# Patient Record
Sex: Female | Born: 1975 | Race: Black or African American | Hispanic: No | Marital: Single | Smoking: Current some day smoker
Health system: Southern US, Community
[De-identification: ages and names within clinical notes are randomized; demographics above are authoritative.]

## PROBLEM LIST (undated history)

## (undated) DIAGNOSIS — T7840XA Allergy, unspecified, initial encounter: Secondary | ICD-10-CM

## (undated) HISTORY — PX: BREAST EXCISIONAL BIOPSY: SUR124

## (undated) HISTORY — DX: Allergy, unspecified, initial encounter: T78.40XA

## (undated) HISTORY — PX: BREAST SURGERY: SHX581

---

## 2008-12-23 ENCOUNTER — Emergency Department (HOSPITAL_COMMUNITY): Admission: EM | Admit: 2008-12-23 | Discharge: 2008-12-23 | Payer: Self-pay | Admitting: Emergency Medicine

## 2008-12-24 ENCOUNTER — Emergency Department (HOSPITAL_COMMUNITY): Admission: EM | Admit: 2008-12-24 | Discharge: 2008-12-24 | Payer: Self-pay | Admitting: Family Medicine

## 2009-10-29 ENCOUNTER — Emergency Department (HOSPITAL_COMMUNITY): Admission: EM | Admit: 2009-10-29 | Discharge: 2009-10-29 | Payer: Self-pay | Admitting: Emergency Medicine

## 2010-03-30 ENCOUNTER — Emergency Department (HOSPITAL_COMMUNITY): Admission: EM | Admit: 2010-03-30 | Discharge: 2010-03-30 | Payer: Self-pay | Admitting: Emergency Medicine

## 2010-11-14 ENCOUNTER — Inpatient Hospital Stay (HOSPITAL_COMMUNITY): Admission: EM | Admit: 2010-11-14 | Discharge: 2010-11-16 | Payer: Self-pay | Admitting: Emergency Medicine
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
## 2011-03-09 LAB — DIFFERENTIAL
Basophils Relative: 0 % (ref 0–1)
Eosinophils Absolute: 0 10*3/uL (ref 0.0–0.7)
Eosinophils Relative: 2 % (ref 0–5)
Lymphocytes Relative: 7 % — ABNORMAL LOW (ref 12–46)
Lymphs Abs: 0.6 10*3/uL — ABNORMAL LOW (ref 0.7–4.0)
Monocytes Absolute: 0.5 10*3/uL (ref 0.1–1.0)
Monocytes Relative: 4 % (ref 3–12)
Neutro Abs: 12.3 10*3/uL — ABNORMAL HIGH (ref 1.7–7.7)
Neutrophils Relative %: 92 % — ABNORMAL HIGH (ref 43–77)

## 2011-03-09 LAB — POCT I-STAT, CHEM 8
BUN: <3 mg/dL — ABNORMAL LOW (ref 6–23)
Calcium, Ion: 1.18 mmol/L (ref 1.12–1.32)
Chloride: 108 mEq/L (ref 96–112)
Glucose, Bld: 122 mg/dL — ABNORMAL HIGH (ref 70–99)
TCO2: 23 mmol/L (ref 0–100)

## 2011-03-09 LAB — CBC
HCT: 38.8 % (ref 36.0–46.0)
Hemoglobin: 13.3 g/dL (ref 12.0–15.0)
MCH: 33.8 pg (ref 26.0–34.0)
MCHC: 34.2 g/dL (ref 30.0–36.0)
MCV: 99 fL (ref 78.0–100.0)
Platelets: 287 10*3/uL (ref 150–400)
RBC: 3.5 MIL/uL — ABNORMAL LOW (ref 3.87–5.11)
WBC: 8.6 10*3/uL (ref 4.0–10.5)

## 2011-03-09 LAB — COMPREHENSIVE METABOLIC PANEL
BUN: 5 mg/dL — ABNORMAL LOW (ref 6–23)
CO2: 20 mEq/L (ref 19–32)
Chloride: 107 mEq/L (ref 96–112)
Creatinine, Ser: 0.97 mg/dL (ref 0.4–1.2)
GFR calc non Af Amer: >60 mL/min (ref >60)
Total Bilirubin: 0.4 mg/dL (ref 0.3–1.2)

## 2011-03-09 LAB — BASIC METABOLIC PANEL
Calcium: 9.3 mg/dL (ref 8.4–10.5)
Creatinine, Ser: 0.89 mg/dL (ref 0.4–1.2)
GFR calc Af Amer: >60 mL/min (ref >60)
GFR calc non Af Amer: >60 mL/min (ref >60)

## 2011-03-09 LAB — HEMOGLOBIN A1C
Hgb A1c MFr Bld: 5.2 % (ref <5.7)
Mean Plasma Glucose: 103 mg/dL (ref <117)

## 2011-03-09 LAB — PREGNANCY, URINE: Preg Test, Ur: NEGATIVE

## 2011-03-31 LAB — URINALYSIS, ROUTINE W REFLEX MICROSCOPIC
Ketones, ur: 15 mg/dL — AB
Nitrite: NEGATIVE
pH: 6 (ref 5.0–8.0)

## 2011-03-31 LAB — URINE MICROSCOPIC-ADD ON

## 2011-04-16 ENCOUNTER — Emergency Department (HOSPITAL_COMMUNITY)
Admission: EM | Admit: 2011-04-16 | Discharge: 2011-04-17 | Disposition: A | Discharge status: Home / Self Care | Payer: Self-pay | Attending: Emergency Medicine | Admitting: Emergency Medicine

## 2011-04-16 ENCOUNTER — Emergency Department (HOSPITAL_COMMUNITY): Payer: Self-pay

## 2011-04-16 DIAGNOSIS — J45909 Unspecified asthma, uncomplicated: Secondary | ICD-10-CM | POA: Insufficient documentation

## 2011-04-16 DIAGNOSIS — R0602 Shortness of breath: Secondary | ICD-10-CM | POA: Insufficient documentation

## 2011-04-16 DIAGNOSIS — R0682 Tachypnea, not elsewhere classified: Secondary | ICD-10-CM | POA: Insufficient documentation

## 2011-04-16 LAB — CBC
HCT: 37.6 % (ref 36.0–46.0)
Hemoglobin: 12.9 g/dL (ref 12.0–15.0)
MCH: 32.3 pg (ref 26.0–34.0)
MCV: 94.2 fL (ref 78.0–100.0)
RBC: 3.99 MIL/uL (ref 3.87–5.11)

## 2011-04-16 LAB — BASIC METABOLIC PANEL
BUN: 5 mg/dL — ABNORMAL LOW (ref 6–23)
CO2: 24 mEq/L (ref 19–32)
Calcium: 9.2 mg/dL (ref 8.4–10.5)
Creatinine, Ser: 0.84 mg/dL (ref 0.4–1.2)
GFR calc Af Amer: >60 mL/min (ref >60)
Glucose, Bld: 99 mg/dL (ref 70–99)

## 2011-04-16 LAB — DIFFERENTIAL
Eosinophils Absolute: 1.4 10*3/uL — ABNORMAL HIGH (ref 0.0–0.7)
Lymphocytes Relative: 19 % (ref 12–46)
Lymphs Abs: 2.8 10*3/uL (ref 0.7–4.0)
Monocytes Relative: 7 % (ref 3–12)
Neutro Abs: 9.7 10*3/uL — ABNORMAL HIGH (ref 1.7–7.7)
Neutrophils Relative %: 65 % (ref 43–77)

## 2011-05-24 ENCOUNTER — Inpatient Hospital Stay (INDEPENDENT_AMBULATORY_CARE_PROVIDER_SITE_OTHER)
Admission: RE | Admit: 2011-05-24 | Discharge: 2011-05-24 | Disposition: A | Discharge status: Home / Self Care | Payer: Self-pay | Source: Ambulatory Visit | Attending: Family Medicine | Admitting: Family Medicine

## 2011-05-24 DIAGNOSIS — J45909 Unspecified asthma, uncomplicated: Secondary | ICD-10-CM

## 2011-08-04 ENCOUNTER — Inpatient Hospital Stay (INDEPENDENT_AMBULATORY_CARE_PROVIDER_SITE_OTHER)
Admission: RE | Admit: 2011-08-04 | Discharge: 2011-08-04 | Disposition: A | Discharge status: Home / Self Care | Payer: Self-pay | Source: Ambulatory Visit | Attending: Family Medicine | Admitting: Family Medicine

## 2011-08-04 DIAGNOSIS — J45909 Unspecified asthma, uncomplicated: Secondary | ICD-10-CM

## 2011-08-24 ENCOUNTER — Emergency Department (HOSPITAL_COMMUNITY)
Admission: EM | Admit: 2011-08-24 | Discharge: 2011-08-24 | Disposition: A | Discharge status: Home / Self Care | Payer: Self-pay | Attending: Emergency Medicine | Admitting: Emergency Medicine

## 2011-08-24 ENCOUNTER — Emergency Department (HOSPITAL_COMMUNITY): Payer: Self-pay

## 2011-08-24 DIAGNOSIS — M940 Chondrocostal junction syndrome [Tietze]: Secondary | ICD-10-CM | POA: Insufficient documentation

## 2011-08-24 DIAGNOSIS — R071 Chest pain on breathing: Secondary | ICD-10-CM | POA: Insufficient documentation

## 2011-08-24 DIAGNOSIS — R0682 Tachypnea, not elsewhere classified: Secondary | ICD-10-CM | POA: Insufficient documentation

## 2011-08-24 DIAGNOSIS — J069 Acute upper respiratory infection, unspecified: Secondary | ICD-10-CM | POA: Insufficient documentation

## 2011-08-24 DIAGNOSIS — R000 Tachycardia, unspecified: Secondary | ICD-10-CM | POA: Insufficient documentation

## 2011-08-24 DIAGNOSIS — R0602 Shortness of breath: Secondary | ICD-10-CM | POA: Insufficient documentation

## 2011-08-24 DIAGNOSIS — R51 Headache: Secondary | ICD-10-CM | POA: Insufficient documentation

## 2011-08-24 DIAGNOSIS — J3489 Other specified disorders of nose and nasal sinuses: Secondary | ICD-10-CM | POA: Insufficient documentation

## 2011-08-24 DIAGNOSIS — R05 Cough: Secondary | ICD-10-CM | POA: Insufficient documentation

## 2011-08-24 DIAGNOSIS — R059 Cough, unspecified: Secondary | ICD-10-CM | POA: Insufficient documentation

## 2011-08-24 DIAGNOSIS — R070 Pain in throat: Secondary | ICD-10-CM | POA: Insufficient documentation

## 2011-08-24 DIAGNOSIS — J45909 Unspecified asthma, uncomplicated: Secondary | ICD-10-CM | POA: Insufficient documentation

## 2011-08-24 DIAGNOSIS — M549 Dorsalgia, unspecified: Secondary | ICD-10-CM | POA: Insufficient documentation

## 2011-08-24 LAB — DIFFERENTIAL
Basophils Absolute: 0 10*3/uL (ref 0.0–0.1)
Basophils Relative: 0 % (ref 0–1)
Eosinophils Absolute: 0.5 10*3/uL (ref 0.0–0.7)
Neutro Abs: 11.3 10*3/uL — ABNORMAL HIGH (ref 1.7–7.7)
Neutrophils Relative %: 70 % (ref 43–77)

## 2011-08-24 LAB — POCT I-STAT, CHEM 8
BUN: 9 mg/dL (ref 6–23)
Creatinine, Ser: 1 mg/dL (ref 0.50–1.10)
Hemoglobin: 13.9 g/dL (ref 12.0–15.0)
Potassium: 4.2 mEq/L (ref 3.5–5.1)
Sodium: 138 mEq/L (ref 135–145)

## 2011-08-24 LAB — URINALYSIS, ROUTINE W REFLEX MICROSCOPIC
Glucose, UA: NEGATIVE mg/dL
Hgb urine dipstick: NEGATIVE
Ketones, ur: 15 mg/dL — AB
pH: 5.5 (ref 5.0–8.0)

## 2011-08-24 LAB — URINE MICROSCOPIC-ADD ON

## 2011-08-24 LAB — CBC
Hemoglobin: 13.2 g/dL (ref 12.0–15.0)
MCHC: 35.8 g/dL (ref 30.0–36.0)
Platelets: 296 10*3/uL (ref 150–400)
RBC: 4.01 MIL/uL (ref 3.87–5.11)

## 2011-08-24 LAB — D-DIMER, QUANTITATIVE: D-Dimer, Quant: 0.45 ug/mL-FEU (ref 0.00–0.48)

## 2011-09-09 ENCOUNTER — Emergency Department (HOSPITAL_COMMUNITY)
Admission: EM | Admit: 2011-09-09 | Discharge: 2011-09-10 | Disposition: A | Discharge status: Home / Self Care | Payer: Self-pay | Attending: Emergency Medicine | Admitting: Emergency Medicine

## 2011-09-09 ENCOUNTER — Emergency Department (HOSPITAL_COMMUNITY): Payer: Self-pay

## 2011-09-09 DIAGNOSIS — J45909 Unspecified asthma, uncomplicated: Secondary | ICD-10-CM | POA: Insufficient documentation

## 2011-09-09 DIAGNOSIS — R0602 Shortness of breath: Secondary | ICD-10-CM | POA: Insufficient documentation

## 2011-09-09 LAB — CBC
HCT: 36 % (ref 36.0–46.0)
Hemoglobin: 12.5 g/dL (ref 12.0–15.0)
MCH: 32.5 pg (ref 26.0–34.0)
MCHC: 34.7 g/dL (ref 30.0–36.0)
MCV: 93.5 fL (ref 78.0–100.0)
Platelets: 347 10*3/uL (ref 150–400)
RBC: 3.85 MIL/uL — ABNORMAL LOW (ref 3.87–5.11)
RDW: 12.6 % (ref 11.5–15.5)
WBC: 10.7 10*3/uL — ABNORMAL HIGH (ref 4.0–10.5)

## 2011-09-09 LAB — POCT I-STAT, CHEM 8
Creatinine, Ser: 0.8 mg/dL (ref 0.50–1.10)
Hemoglobin: 13.3 g/dL (ref 12.0–15.0)
Potassium: 3.3 mEq/L — ABNORMAL LOW (ref 3.5–5.1)
Sodium: 139 mEq/L (ref 135–145)
TCO2: 22 mmol/L (ref 0–100)

## 2011-09-09 LAB — DIFFERENTIAL
Basophils Absolute: 0 10*3/uL (ref 0.0–0.1)
Basophils Relative: 0 % (ref 0–1)
Eosinophils Absolute: 0.3 10*3/uL (ref 0.0–0.7)
Eosinophils Relative: 2 % (ref 0–5)
Lymphocytes Relative: 22 % (ref 12–46)
Lymphs Abs: 2.4 10*3/uL (ref 0.7–4.0)
Monocytes Absolute: 1.2 10*3/uL — ABNORMAL HIGH (ref 0.1–1.0)
Monocytes Relative: 11 % (ref 3–12)
Neutro Abs: 6.9 10*3/uL (ref 1.7–7.7)
Neutrophils Relative %: 64 % (ref 43–77)

## 2011-10-10 ENCOUNTER — Emergency Department (HOSPITAL_COMMUNITY)
Admission: EM | Admit: 2011-10-10 | Discharge: 2011-10-10 | Disposition: A | Discharge status: Home / Self Care | Payer: Self-pay | Attending: Emergency Medicine | Admitting: Emergency Medicine

## 2011-10-10 DIAGNOSIS — R05 Cough: Secondary | ICD-10-CM | POA: Insufficient documentation

## 2011-10-10 DIAGNOSIS — J45909 Unspecified asthma, uncomplicated: Secondary | ICD-10-CM | POA: Insufficient documentation

## 2011-10-10 DIAGNOSIS — R059 Cough, unspecified: Secondary | ICD-10-CM | POA: Insufficient documentation

## 2011-10-25 ENCOUNTER — Emergency Department (HOSPITAL_COMMUNITY): Payer: No Typology Code available for payment source

## 2011-10-25 ENCOUNTER — Emergency Department (HOSPITAL_COMMUNITY)
Admission: EM | Admit: 2011-10-25 | Discharge: 2011-10-25 | Disposition: A | Discharge status: Home / Self Care | Payer: No Typology Code available for payment source | Attending: Emergency Medicine | Admitting: Emergency Medicine

## 2011-10-25 DIAGNOSIS — R51 Headache: Secondary | ICD-10-CM | POA: Insufficient documentation

## 2011-10-25 DIAGNOSIS — M542 Cervicalgia: Secondary | ICD-10-CM | POA: Insufficient documentation

## 2011-10-25 DIAGNOSIS — M545 Low back pain, unspecified: Secondary | ICD-10-CM | POA: Insufficient documentation

## 2011-10-25 DIAGNOSIS — N949 Unspecified condition associated with female genital organs and menstrual cycle: Secondary | ICD-10-CM | POA: Insufficient documentation

## 2011-10-25 DIAGNOSIS — J45909 Unspecified asthma, uncomplicated: Secondary | ICD-10-CM | POA: Insufficient documentation

## 2011-11-13 ENCOUNTER — Encounter: Payer: Self-pay | Admitting: *Deleted

## 2011-11-13 ENCOUNTER — Emergency Department (HOSPITAL_COMMUNITY)
Admission: EM | Admit: 2011-11-13 | Discharge: 2011-11-14 | Disposition: A | Discharge status: Home / Self Care | Payer: Self-pay | Attending: Emergency Medicine | Admitting: Emergency Medicine

## 2011-11-13 DIAGNOSIS — F17200 Nicotine dependence, unspecified, uncomplicated: Secondary | ICD-10-CM | POA: Insufficient documentation

## 2011-11-13 DIAGNOSIS — J45901 Unspecified asthma with (acute) exacerbation: Secondary | ICD-10-CM

## 2011-11-13 DIAGNOSIS — R0989 Other specified symptoms and signs involving the circulatory and respiratory systems: Secondary | ICD-10-CM | POA: Insufficient documentation

## 2011-11-13 DIAGNOSIS — R0602 Shortness of breath: Secondary | ICD-10-CM | POA: Insufficient documentation

## 2011-11-13 DIAGNOSIS — R059 Cough, unspecified: Secondary | ICD-10-CM | POA: Insufficient documentation

## 2011-11-13 DIAGNOSIS — Z79899 Other long term (current) drug therapy: Secondary | ICD-10-CM | POA: Insufficient documentation

## 2011-11-13 DIAGNOSIS — R05 Cough: Secondary | ICD-10-CM | POA: Insufficient documentation

## 2011-11-13 MED ORDER — PREDNISONE 20 MG PO TABS
40.0000 mg | ORAL_TABLET | Freq: Once | ORAL | Status: AC
  Administered 2011-11-14: 40 mg via ORAL
  Filled 2011-11-13: qty 2

## 2011-11-13 MED ORDER — ALBUTEROL SULFATE HFA 108 (90 BASE) MCG/ACT IN AERS
2.0000 | INHALATION_SPRAY | Freq: Once | RESPIRATORY_TRACT | Status: DC
  Filled 2011-11-13: qty 6.7

## 2011-11-13 MED ORDER — FLUTICASONE PROPIONATE HFA 44 MCG/ACT IN AERO
1.0000 | INHALATION_SPRAY | Freq: Once | RESPIRATORY_TRACT | Status: AC
  Administered 2011-11-14: 1 via RESPIRATORY_TRACT

## 2011-11-13 MED ORDER — ALBUTEROL SULFATE (5 MG/ML) 0.5% IN NEBU
5.0000 mg | INHALATION_SOLUTION | Freq: Once | RESPIRATORY_TRACT | Status: AC
  Administered 2011-11-13: 5 mg via RESPIRATORY_TRACT

## 2011-11-13 NOTE — ED Notes (Signed)
Patient experiencing asthma exacerbation.   Patient is very short of breath, when she talks she gets short of breath even worse, sweathing

## 2011-11-14 ENCOUNTER — Encounter (HOSPITAL_COMMUNITY): Payer: Self-pay | Admitting: Emergency Medicine

## 2011-11-14 MED ORDER — PREDNISONE 20 MG PO TABS: 40.0000 mg | ORAL_TABLET | Freq: Every day | ORAL | Status: AC

## 2011-11-14 NOTE — ED Provider Notes (Signed)
History     CSN: [id number] Arrival date & time: 11/13/2011 11:17 PM   First MD Initiated Contact with Patient 11/13/11 2339      Chief Complaint  Patient presents with  . Asthma    (Consider location/radiation/quality/duration/timing/severity/associated sxs/prior treatment) HPI Comments: Patient reports that she was diagnosed with asthma approximately 2 or 3 years ago. She has cut down significantly on smoking, does not smoke regularly, occasionally smokes socially. She has not smoked this entire week. She reports that she had some chest congestion last week and still has a mild cough but no production. She denies any fever, chills, or runny nose, sore throat. She denies any vomiting or diarrhea. She is nearly out of her current rescue inhaler. She has been taking some over-the-counter cough syrup as well. She does not have a primary care physician in his never been on many times maintenance medications. She reports that over the past year her asthma attack frequency has increased. She thinks she may have one or 2 attacks per week at this point. She did receive an albuterol nebulizer by protocol upon arrival here to the emergency department and she reports feeling much improved already. Denies any chest pain, sweats.  Patient is a 35 y.o. female presenting with asthma. The history is provided by the patient.  Asthma Associated symptoms include shortness of breath.    Past Medical History  Diagnosis Date  . Asthma     History reviewed. No pertinent past surgical history.  History reviewed. No pertinent family history.  History  Substance Use Topics  . Smoking status: Current Some Day Smoker    Types: Cigarettes  . Smokeless tobacco: Not on file  . Alcohol Use: No    OB History    Grav Para Term Preterm Abortions TAB SAB Ect Mult Living                  Review of Systems  Constitutional: Negative.   HENT: Negative for congestion and postnasal drip.   Respiratory:  Positive for cough, shortness of breath and wheezing.   All other systems reviewed and are negative.    Allergies  Review of patient's allergies indicates no known allergies.  Home Medications   Current Outpatient Rx  Name Route Sig Dispense Refill  . ALBUTEROL SULFATE HFA 108 (90 BASE) MCG/ACT IN AERS Inhalation Inhale 2 puffs into the lungs every 4 (four) hours as needed. For shortness of breath.    . GUAIFENESIN-DM 100-10 MG/5ML PO SYRP Oral Take 10 mLs by mouth 3 (three) times daily as needed. For congestion.     Marland Kitchen PREDNISONE PO Oral Take by mouth. Patient was taking Prednisone; she said she is out and needs more, and also stated that everytime she was prescribed Prednisone, that it changes dosage.       BP 110/62  Pulse 109  Temp(Src) 98.2 F (36.8 C) (Oral)  Resp 24  SpO2 95%  LMP 09/25/2011  Physical Exam  Nursing note and vitals reviewed. Constitutional: She appears well-developed and well-nourished.  Cardiovascular: Normal rate.   Pulmonary/Chest: Effort normal. No respiratory distress.       Minimal expiratory wheeze diffusely. No coughing during my entire history and physical examination.    ED Course  Procedures (including critical care time)  Labs Reviewed - No data to display No results found.   No diagnosis found.  RA sat is 95% and WNL for an asthmatic  MDM    Patient is already feeling improved after her  nebulizer treatment. She is nearly out of her current inhaler. My plan is to give her an inhaler for home as well as give her a trial of maintenance Flovent. She knows that she needs to obtain a primary care physician so she does not have to come the emergency department as recently. We did discuss fully quitting smoking which she is encouraged to try to do. I do not suspect that she had any cardiac or a significant infectious process. As patient is RE feeling improved the patient will likely be able to be discharged after her medications. I also plan  to give her 40 mg of prednisone to continue for the next 5 days.        Gavin Pound. Oletta Lamas, MD 11/14/11 0010

## 2011-11-14 NOTE — Discharge Instructions (Signed)
 Please use albuterol  inhaler 2 puffs every 6 hours for the next 24 hours, then may use as needed every 4-6 hours.  Use flovent  to help prevent flares, 1 puff twice daily.  I encourage that you stop smoking completely and that you establish with a primary care physician.     Asthma Attack Prevention HOW CAN ASTHMA BE PREVENTED? Currently, there is no way to prevent asthma from starting. However, you can take steps to control the disease and prevent its symptoms after you have been diagnosed. Learn about your asthma and how to control it. Take an active role to control your asthma by working with your caregiver to create and follow an asthma action plan. An asthma action plan guides you in taking your medicines properly, avoiding factors that make your asthma worse, tracking your level of asthma control, responding to worsening asthma, and seeking emergency care when needed. To track your asthma, keep records of your symptoms, check your peak flow number using a peak flow meter (handheld device that shows how well air moves out of your lungs), and get regular asthma checkups.  Other ways to prevent asthma attacks include:  Use medicines as your caregiver directs.   Identify and avoid things that make your asthma worse (as much as you can).   Keep track of your asthma symptoms and level of control.   Get regular checkups for your asthma.   With your caregiver, write a detailed plan for taking medicines and managing an asthma attack. Then be sure to follow your action plan. Asthma is an ongoing condition that needs regular monitoring and treatment.   Identify and avoid asthma triggers. A number of outdoor allergens and irritants (pollen, mold, cold air, air pollution) can trigger asthma attacks. Find out what causes or makes your asthma worse, and take steps to avoid those triggers (see below).   Monitor your breathing. Learn to recognize warning signs of an attack, such as slight coughing,  wheezing or shortness of breath. However, your lung function may already decrease before you notice any signs or symptoms, so regularly measure and record your peak airflow with a home peak flow meter.   Identify and treat attacks early. If you act quickly, you're less likely to have a severe attack. You will also need less medicine to control your symptoms. When your peak flow measurements decrease and alert you to an upcoming attack, take your medicine as instructed, and immediately stop any activity that may have triggered the attack. If your symptoms do not improve, get medical help.   Pay attention to increasing quick-relief inhaler use. If you find yourself relying on your quick-relief inhaler (such as albuterol ), your asthma is not under control. See your caregiver about adjusting your treatment.  IDENTIFY AND CONTROL FACTORS THAT MAKE YOUR ASTHMA WORSE A number of common things can set off or make your asthma symptoms worse (asthma triggers). Keep track of your asthma symptoms for several weeks, detailing all the environmental and emotional factors that are linked with your asthma. When you have an asthma attack, go back to your asthma diary to see which factor, or combination of factors, might have contributed to it. Once you know what these factors are, you can take steps to control many of them.  Allergies: If you have allergies and asthma, it is important to take asthma prevention steps at home. Asthma attacks (worsening of asthma symptoms) can be triggered by allergies, which can cause temporary increased inflammation of your airways. Minimizing contact  with the substance to which you are allergic will help prevent an asthma attack. Animal Dander:   Some people are allergic to the flakes of skin or dried saliva from animals with fur or feathers. Keep these pets out of your home.   If you can't keep a pet outdoors, keep the pet out of your bedroom and other sleeping areas at all times, and  keep the door closed.   Remove carpets and furniture covered with cloth from your home. If that is not possible, keep the pet away from fabric-covered furniture and carpets.  Dust Mites:  Many people with asthma are allergic to dust mites. Dust mites are tiny bugs that are found in every home, in mattresses, pillows, carpets, fabric-covered furniture, bedcovers, clothes, stuffed toys, fabric, and other fabric-covered items.   Cover your mattress in a special dust-proof cover.   Cover your pillow in a special dust-proof cover, or wash the pillow each week in hot water. Water must be hotter than 130 F to kill dust mites. Cold or warm water used with detergent and bleach can also be effective.   Wash the sheets and blankets on your bed each week in hot water.   Try not to sleep or lie on cloth-covered cushions.   Call ahead when traveling and ask for a smoke-free hotel room. Bring your own bedding and pillows, in case the hotel only supplies feather pillows and down comforters, which may contain dust mites and cause asthma symptoms.   Remove carpets from your bedroom and those laid on concrete, if you can.   Keep stuffed toys out of the bed, or wash the toys weekly in hot water or cooler water with detergent and bleach.  Cockroaches:  Many people with asthma are allergic to the droppings and remains of cockroaches.   Keep food and garbage in closed containers. Never leave food out.   Use poison baits, traps, powders, gels, or paste (for example, boric acid).   If a spray is used to kill cockroaches, stay out of the room until the odor goes away.  Indoor Mold:  Fix leaky faucets, pipes, or other sources of water that have mold around them.   Clean moldy surfaces with a cleaner that has bleach in it.  Pollen and Outdoor Mold:  When pollen or mold spore counts are high, try to keep your windows closed.   Stay indoors with windows closed from late morning to afternoon, if you can.  Pollen and some mold spore counts are highest at that time.   Ask your caregiver whether you need to take or increase anti-inflammatory medicine before your allergy season starts.  Irritants:   Tobacco smoke is an irritant. If you smoke, ask your caregiver how you can quit. Ask family members to quit smoking, too. Do not allow smoking in your home or car.   If possible, do not use a wood-burning stove, kerosene heater, or fireplace. Minimize exposure to all sources of smoke, including incense, candles, fires, and fireworks.   Try to stay away from strong odors and sprays, such as perfume, talcum powder, hair spray, and paints.   Decrease humidity in your home and use an indoor air cleaning device. Reduce indoor humidity to below 60 percent. Dehumidifiers or central air conditioners can do this.   Try to have someone else vacuum for you once or twice a week, if you can. Stay out of rooms while they are being vacuumed and for a short while afterward.  If you vacuum, use a dust mask from a hardware store, a double-layered or microfilter vacuum cleaner bag, or a vacuum cleaner with a HEPA filter.   Sulfites in foods and beverages can be irritants. Do not drink beer or wine, or eat dried fruit, processed potatoes, or shrimp if they cause asthma symptoms.   Cold air can trigger an asthma attack. Cover your nose and mouth with a scarf on cold or windy days.   Several health conditions can make asthma more difficult to manage, including runny nose, sinus infections, reflux disease, psychological stress, and sleep apnea. Your caregiver will treat these conditions, as well.   Avoid close contact with people who have a cold or the flu, since your asthma symptoms may get worse if you catch the infection from them. Wash your hands thoroughly after touching items that may have been handled by people with a respiratory infection.   Get a flu shot every year to protect against the flu virus, which often  makes asthma worse for days or weeks. Also get a pneumonia shot once every five to 10 years.  Drugs:  Aspirin and other painkillers can cause asthma attacks. 10% to 20% of people with asthma have sensitivity to aspirin or a group of painkillers called non-steroidal anti-inflammatory drugs (NSAIDS), such as ibuprofen  and naproxen . These drugs are used to treat pain and reduce fevers. Asthma attacks caused by any of these medicines can be severe and even fatal. These drugs must be avoided in people who have known aspirin sensitive asthma. Products with acetaminophen  are considered safe for people who have asthma. It is important that people with aspirin sensitivity read labels of all over-the-counter drugs used to treat pain, colds, coughs, and fever.   Beta blockers and ACE inhibitors are other drugs which you should discuss with your caregiver, in relation to your asthma.  ALLERGY SKIN TESTING  Ask your asthma caregiver about allergy skin testing or blood testing (RAST test) to identify the allergens to which you are sensitive. If you are found to have allergies, allergy shots (immunotherapy) for asthma may help prevent future allergies and asthma. With allergy shots, small doses of allergens (substances to which you are allergic) are injected under your skin on a regular schedule. Over a period of time, your body may become used to the allergen and less responsive with asthma symptoms. You can also take measures to minimize your exposure to those allergens. EXERCISE  If you have exercise-induced asthma, or are planning vigorous exercise, or exercise in cold, humid, or dry environments, prevent exercise-induced asthma by following your caregiver's advice regarding asthma treatment before exercising. Document Released: 12/01/2009 Document Revised: 08/25/2011 Document Reviewed: 12/01/2009 [facility] Patient Information 2012 [place], [place].

## 2011-12-17 ENCOUNTER — Encounter (HOSPITAL_COMMUNITY): Payer: Self-pay | Admitting: *Deleted

## 2011-12-17 ENCOUNTER — Emergency Department (HOSPITAL_COMMUNITY)
Admission: EM | Admit: 2011-12-17 | Discharge: 2011-12-17 | Disposition: A | Discharge status: Home / Self Care | Payer: Self-pay | Attending: Emergency Medicine | Admitting: Emergency Medicine

## 2011-12-17 DIAGNOSIS — R0602 Shortness of breath: Secondary | ICD-10-CM | POA: Insufficient documentation

## 2011-12-17 DIAGNOSIS — J45909 Unspecified asthma, uncomplicated: Secondary | ICD-10-CM | POA: Insufficient documentation

## 2011-12-17 MED ORDER — PREDNISONE 10 MG PO TABS: 20.0000 mg | ORAL_TABLET | Freq: Every day | ORAL | Status: DC

## 2011-12-17 MED ORDER — ALBUTEROL SULFATE HFA 108 (90 BASE) MCG/ACT IN AERS
2.0000 | INHALATION_SPRAY | RESPIRATORY_TRACT | Status: DC | PRN
  Administered 2011-12-17: 2 via RESPIRATORY_TRACT
  Filled 2011-12-17: qty 6.7

## 2011-12-17 MED ORDER — ALBUTEROL SULFATE (5 MG/ML) 0.5% IN NEBU
5.0000 mg | INHALATION_SOLUTION | Freq: Once | RESPIRATORY_TRACT | Status: AC
  Administered 2011-12-17: 5 mg via RESPIRATORY_TRACT
  Filled 2011-12-17: qty 1

## 2011-12-17 MED ORDER — IPRATROPIUM BROMIDE 0.02 % IN SOLN
0.5000 mg | Freq: Once | RESPIRATORY_TRACT | Status: AC
  Administered 2011-12-17: 0.5 mg via RESPIRATORY_TRACT
  Filled 2011-12-17: qty 2.5

## 2011-12-17 NOTE — ED Provider Notes (Signed)
History     CSN: [id number]  Arrival date & time 12/17/11  5284   First MD Initiated Contact with Patient 12/17/11 [phone]      Chief Complaint  Patient presents with  . Asthma    (Consider location/radiation/quality/duration/timing/severity/associated sxs/prior treatment) Patient is a 35 y.o. female presenting with asthma. The history is provided by the patient.  Asthma This is a chronic problem. The current episode started yesterday. The problem occurs constantly. The problem has been gradually worsening. Associated symptoms include coughing. Pertinent negatives include no chest pain, chills, fever, myalgias, neck pain, sore throat or vomiting. The symptoms are aggravated by nothing. Treatments tried: Albuterol. The treatment provided moderate relief.   patient has been hospitalized one time in the past for her asthma, approximately one year ago. She has no prior ICU admissions or intubations. She reports that she has been unable to fill her medications as prescribed by her regular Dr. for her asthma. Her inhaler is empty. She did receive 1 nebulized breathing treatment of albuterol and Atrovent prior to my assessment and reports a significant improvement in symptoms.  Past Medical History  Diagnosis Date  . Asthma     History reviewed. No pertinent past surgical history.  History reviewed. No pertinent family history.  History  Substance Use Topics  . Smoking status: Current Some Day Smoker    Types: Cigarettes  . Smokeless tobacco: Not on file  . Alcohol Use: No     Review of Systems  Constitutional: Negative for fever and chills.  HENT: Negative for sore throat and neck pain.   Respiratory: Positive for cough, shortness of breath and wheezing. Negative for chest tightness.   Cardiovascular: Negative for chest pain.  Gastrointestinal: Negative for vomiting.  Musculoskeletal: Negative for myalgias.  All other systems reviewed and are negative.    Allergies  Review  of patient's allergies indicates no known allergies.  Home Medications   Current Outpatient Rx  Name Route Sig Dispense Refill  . ALBUTEROL SULFATE HFA 108 (90 BASE) MCG/ACT IN AERS Inhalation Inhale 2 puffs into the lungs every 4 (four) hours as needed. For shortness of breath.    Marland Kitchen PREDNISONE PO Oral Take 10 mg by mouth daily. Patient was taking Prednisone; she said she is out and needs more, and also stated that everytime she was prescribed Prednisone, that it changes dosage.    . GUAIFENESIN-DM 100-10 MG/5ML PO SYRP Oral Take 10 mLs by mouth 3 (three) times daily as needed. For congestion.       BP 105/64  Pulse 81  Temp(Src) 98.8 F (37.1 C) (Oral)  Resp 19  Wt 166 lb (75.297 kg)  SpO2 98%  LMP 12/07/2011  Physical Exam  Nursing note and vitals reviewed. Constitutional: She is oriented to person, place, and time. She appears well-developed and well-nourished. No distress.  HENT:  Head: Normocephalic and atraumatic.  Right Ear: External ear normal.  Left Ear: External ear normal.  Mouth/Throat: Oropharynx is clear and moist.  Eyes: Pupils are equal, round, and reactive to light. No scleral icterus.  Neck: Normal range of motion. Neck supple.  Cardiovascular: Normal rate, regular rhythm, normal heart sounds and intact distal pulses.   Pulmonary/Chest: Effort normal. No respiratory distress. She exhibits no tenderness.       Speaking in complete sentences without difficulty. Mild expiratory wheezes  Abdominal: Soft. Bowel sounds are normal. She exhibits no distension. There is no tenderness.  Musculoskeletal: Normal range of motion. She exhibits no edema and no  tenderness.  Neurological: She is alert and oriented to person, place, and time. No cranial nerve deficit. Coordination normal.  Skin: Skin is dry. No rash noted.    ED Course  Procedures (including critical care time)  Labs Reviewed - No data to display No results found.   Dx 1: asthma   MDM  After second  breathing treatment, patient with complete resolution of wheezing on auscultation. She requests a new inhaler which I will order for her. We discussed the possibility that the case manager could help her with prescriptions but at this is typically limited to one episode of assistance within the calendar year. She reports that she will make sure to find the funds and fill her asthma medications as prescribed by her regular doctor.        Elwyn Reach [place], [place] 12/17/11 1218

## 2011-12-17 NOTE — ED Provider Notes (Signed)
Medical screening examination/treatment/procedure(s) were performed by non-physician practitioner and as supervising physician I was immediately available for consultation/collaboration.   Glynn Octave, MD 12/17/11 [phone]

## 2011-12-17 NOTE — ED Notes (Signed)
Pt states "I'm out of my inhaler"

## 2011-12-31 ENCOUNTER — Encounter (HOSPITAL_COMMUNITY): Payer: Self-pay | Admitting: Emergency Medicine

## 2011-12-31 ENCOUNTER — Emergency Department (HOSPITAL_COMMUNITY): Payer: Self-pay

## 2011-12-31 ENCOUNTER — Emergency Department (HOSPITAL_COMMUNITY)
Admission: EM | Admit: 2011-12-31 | Discharge: 2011-12-31 | Disposition: A | Discharge status: Home / Self Care | Payer: Self-pay | Attending: Emergency Medicine | Admitting: Emergency Medicine

## 2011-12-31 DIAGNOSIS — J45901 Unspecified asthma with (acute) exacerbation: Secondary | ICD-10-CM | POA: Insufficient documentation

## 2011-12-31 MED ORDER — METHYLPREDNISOLONE SODIUM SUCC 125 MG IJ SOLR: 125.0000 mg | Freq: Once | INTRAMUSCULAR | Status: DC

## 2011-12-31 MED ORDER — OXYCODONE-ACETAMINOPHEN 5-325 MG PO TABS
1.0000 | ORAL_TABLET | Freq: Once | ORAL | Status: AC
  Administered 2011-12-31: 1 via ORAL
  Filled 2011-12-31: qty 1

## 2011-12-31 MED ORDER — ALBUTEROL SULFATE HFA 108 (90 BASE) MCG/ACT IN AERS
2.0000 | INHALATION_SPRAY | RESPIRATORY_TRACT | Status: DC | PRN
  Administered 2011-12-31 (×2): 2 via RESPIRATORY_TRACT
  Filled 2011-12-31: qty 6.7

## 2011-12-31 MED ORDER — PREDNISONE 10 MG PO TABS: 20.0000 mg | ORAL_TABLET | Freq: Every day | ORAL | Status: DC

## 2011-12-31 NOTE — ED Provider Notes (Addendum)
History     CSN: [id number]  Arrival date & time 12/31/11  0245   First MD Initiated Contact with Patient 12/31/11 [phone]      Chief Complaint  Patient presents with  . Asthma    (Consider location/radiation/quality/duration/timing/severity/associated sxs/prior treatment) The history is provided by the patient.    Past Medical History  Diagnosis Date  . Asthma     History reviewed. No pertinent past surgical history.  No family history on file.  History  Substance Use Topics  . Smoking status: Current Some Day Smoker    Types: Cigarettes  . Smokeless tobacco: Not on file  . Alcohol Use: No    OB History    Grav Para Term Preterm Abortions TAB SAB Ect Mult Living                  Review of Systems  Constitutional: Negative for activity change.  Respiratory: Positive for shortness of breath and wheezing.   Cardiovascular: Negative for chest pain.  Genitourinary: Negative.   Neurological: Negative for dizziness.    Allergies  Review of patient's allergies indicates no known allergies.  Home Medications   Current Outpatient Rx  Name Route Sig Dispense Refill  . ALBUTEROL SULFATE HFA 108 (90 BASE) MCG/ACT IN AERS Inhalation Inhale 2 puffs into the lungs every 4 (four) hours as needed. For shortness of breath.    Marland Kitchen PREDNISONE 10 MG PO TABS Oral Take 2 tablets (20 mg total) by mouth daily. 10 tablet 0  . PREDNISONE 10 MG PO TABS Oral Take 2 tablets (20 mg total) by mouth daily. 15 tablet 0  . PREDNISONE PO Oral Take 10 mg by mouth daily. Patient was taking Prednisone; she said she is out and needs more, and also stated that everytime she was prescribed Prednisone, that it changes dosage.      BP 105/58  Pulse 110  Temp(Src) 97.9 F (36.6 C) (Oral)  Resp 24  Ht 5\' 4"  (1.626 m)  Wt 163 lb (73.936 kg)  BMI 27.98 kg/m2  SpO2 100%  LMP 12/29/2011  Physical Exam  Constitutional: She appears well-developed.  HENT:  Head: Normocephalic.  Eyes: Pupils are  equal, round, and reactive to light.  Neck: Normal range of motion.  Cardiovascular: Tachycardia present.   Pulmonary/Chest: She has wheezes. She exhibits no tenderness.  Abdominal: Soft.  Musculoskeletal:       Pain across shoulders  Skin: Skin is warm and dry.    ED Course  Procedures (including critical care time)  Labs Reviewed - No data to display Dg Chest 2 View  12/31/2011  *RADIOLOGY REPORT*  Clinical Data: Shortness of breath.  Nonproductive cough.  CHEST - 2 VIEW  Comparison: 09/09/2011  Findings: Mild hyperinflation. The heart size and pulmonary vascularity are normal. The lungs appear clear and expanded without focal air space disease or consolidation. No blunting of the costophrenic angles.  No pneumothorax.  No significant change since previous study.  IMPRESSION: No evidence of active pulmonary disease.  Original Report Authenticated By: Marlon Pel, M.D.     1. Asthma exacerbation     On reexam after patient received second albuterol treatment in the emergency department, no longer wheezing.  Will give her a Percocet here for the pain that she is having across her shoulders and discharge her home with prescription for, prednisone.  We've supplied her with an inhaler.  She can follow up with her primary care physician next week  MDM  Asthma exacerbation  Arman Filter, NP 12/31/11 0557  Arman Filter, NP 12/31/11 0559  Arman Filter, NP 01/26/12 2017

## 2011-12-31 NOTE — ED Notes (Signed)
Pt given discharge instructions and verbalizes understanding  

## 2011-12-31 NOTE — ED Notes (Signed)
Pt to ED with asthma exacerbation. Pt states symptoms started yesterday. Symptoms got worse after taking Mucinex. Pt states used her inhaler yesterday but ran put

## 2011-12-31 NOTE — ED Notes (Signed)
[id number]> Expected date:<BR> Expected time:<BR> Means of arrival:<BR> Comments:<BR> EMS/asthma/wheezing

## 2012-01-01 NOTE — ED Provider Notes (Signed)
Medical screening examination/treatment/procedure(s) were performed by non-physician practitioner and as supervising physician I was immediately available for consultation/collaboration.  Toy Baker, MD 01/01/12 [phone]

## 2012-01-26 NOTE — ED Provider Notes (Signed)
Medical screening examination/treatment/procedure(s) were performed by non-physician practitioner and as supervising physician I was immediately available for consultation/collaboration.  Toy Baker, MD 01/26/12 2103

## 2012-01-28 ENCOUNTER — Encounter (HOSPITAL_COMMUNITY): Payer: Self-pay | Admitting: *Deleted

## 2012-01-28 ENCOUNTER — Emergency Department (HOSPITAL_COMMUNITY)
Admission: EM | Admit: 2012-01-28 | Discharge: 2012-01-29 | Disposition: A | Discharge status: Home / Self Care | Payer: Self-pay | Attending: Emergency Medicine | Admitting: Emergency Medicine

## 2012-01-28 DIAGNOSIS — J45909 Unspecified asthma, uncomplicated: Secondary | ICD-10-CM

## 2012-01-28 DIAGNOSIS — J45901 Unspecified asthma with (acute) exacerbation: Secondary | ICD-10-CM | POA: Insufficient documentation

## 2012-01-28 DIAGNOSIS — R0682 Tachypnea, not elsewhere classified: Secondary | ICD-10-CM | POA: Insufficient documentation

## 2012-01-28 MED ORDER — ALBUTEROL SULFATE (5 MG/ML) 0.5% IN NEBU
5.0000 mg | INHALATION_SOLUTION | Freq: Once | RESPIRATORY_TRACT | Status: AC
  Administered 2012-01-28: 5 mg via RESPIRATORY_TRACT
  Filled 2012-01-28: qty 1

## 2012-01-28 MED ORDER — GUAIFENESIN-CODEINE 100-10 MG/5ML PO SYRP: 5.0000 mL | ORAL_SOLUTION | Freq: Three times a day (TID) | ORAL | Status: AC | PRN

## 2012-01-28 MED ORDER — IPRATROPIUM BROMIDE 0.02 % IN SOLN
0.5000 mg | Freq: Once | RESPIRATORY_TRACT | Status: AC
  Administered 2012-01-28: 0.5 mg via RESPIRATORY_TRACT
  Filled 2012-01-28: qty 2.5

## 2012-01-28 MED ORDER — ALBUTEROL SULFATE (5 MG/ML) 0.5% IN NEBU
5.0000 mg | INHALATION_SOLUTION | Freq: Once | RESPIRATORY_TRACT | Status: DC
  Filled 2012-01-28: qty 1

## 2012-01-28 MED ORDER — PREDNISONE 20 MG PO TABS: 40.0000 mg | ORAL_TABLET | Freq: Once | ORAL | Status: AC

## 2012-01-28 MED ORDER — PREDNISONE 20 MG PO TABS
40.0000 mg | ORAL_TABLET | Freq: Once | ORAL | Status: AC
  Administered 2012-01-28: 40 mg via ORAL
  Filled 2012-01-28: qty 2

## 2012-01-28 MED ORDER — GUAIFENESIN-CODEINE 100-10 MG/5ML PO SOLN
10.0000 mL | Freq: Once | ORAL | Status: AC
  Administered 2012-01-28: 10 mL via ORAL
  Filled 2012-01-28: qty 10

## 2012-01-28 NOTE — ED Notes (Signed)
Pt c/o chest congestions worsening today. Pt presents w/ audible wheezing and shortness of breath. Rescue inhaler not providing relief.

## 2012-01-28 NOTE — ED Provider Notes (Addendum)
History     CSN: [id number]  Arrival date & time 01/28/12  2050   First MD Initiated Contact with Patient 01/28/12 2140      Chief Complaint  Patient presents with  . Asthma  . Wheezing    (Consider location/radiation/quality/duration/timing/severity/associated sxs/prior treatment) HPI Comments: Patient reports that she began developing a upper respiratory infection approximately 2 days ago. She reports that she feels like she has deep chest congestion and despite taking over-the-counter cough medication she has not been able to bring up any mucus. She reports that her chest feels tight and she has been audibly wheezing. She does have an inhaler at home which she has been using which has not provided any significant or long term relief. She endorses some mild sore throat and nasal congestion as well. She denies fever or chills. She denies any GI symptoms such as vomiting or diarrhea. She reports her appetite has been baseline. She reports that she did quit smoking not too long ago.  The history is provided by the patient and medical records.    Past Medical History  Diagnosis Date  . Asthma     History reviewed. No pertinent past surgical history.  History reviewed. No pertinent family history.  History  Substance Use Topics  . Smoking status: Former Smoker    Types: Cigarettes  . Smokeless tobacco: Not on file   Comment: recently quit smoking  . Alcohol Use: No    OB History    Grav Para Term Preterm Abortions TAB SAB Ect Mult Living                  Review of Systems  Constitutional: Negative for fever, chills, appetite change and fatigue.  HENT: Positive for congestion and rhinorrhea.   Respiratory: Positive for cough, chest tightness, shortness of breath and wheezing.   Cardiovascular: Negative for palpitations and leg swelling.  Gastrointestinal: Negative for nausea, vomiting and diarrhea.  Genitourinary: Negative for flank pain.  Musculoskeletal: Negative for  back pain.  All other systems reviewed and are negative.    Allergies  Review of patient's allergies indicates no known allergies.  Home Medications   Current Outpatient Rx  Name Route Sig Dispense Refill  . ALBUTEROL SULFATE HFA 108 (90 BASE) MCG/ACT IN AERS Inhalation Inhale 2 puffs into the lungs every 4 (four) hours as needed. For shortness of breath.    . GUAIFENESIN-CODEINE 100-10 MG/5ML PO SYRP Oral Take 5 mLs by mouth 3 (three) times daily as needed for cough. 120 mL 0  . PREDNISONE 10 MG PO TABS Oral Take 2 tablets (20 mg total) by mouth daily. 15 tablet 0  . PREDNISONE 20 MG PO TABS Oral Take 2 tablets (40 mg total) by mouth once. 10 tablet 0    BP 111/75  Pulse 103  Temp(Src) 98.5 F (36.9 C) (Oral)  Resp 22  SpO2 100%  LMP 01/24/2012  Physical Exam  Nursing note and vitals reviewed. Constitutional: She is oriented to person, place, and time. She appears well-developed and well-nourished.  HENT:  Head: Normocephalic.  Eyes: Pupils are equal, round, and reactive to light.  Neck: Neck supple.  Cardiovascular: Normal rate and normal heart sounds.   Pulmonary/Chest: Tachypnea noted. No respiratory distress. She has wheezes.       Paroxysmal dry coughing, mild discomfort is reproduced with palpation of midsternal region  Abdominal: Soft. She exhibits no distension. There is no tenderness.  Musculoskeletal: Normal range of motion.  Neurological: She is alert and  oriented to person, place, and time.  Skin: Skin is warm, dry and intact. No rash noted. No pallor.  Psychiatric: She has a normal mood and affect.    ED Course  Procedures (including critical care time)  Labs Reviewed - No data to display No results found.   1. Bronchitis with asthma, acute     RA saturation is 95% and normal.    MDM  Likely bronchitis, asthma exacerbation.  Will give nebs, oral steroids, will provide guaifensin and codeine for cough and mild musculoskeletal chest discomfort,  likely due to coughing.  Slight tachycardia is likely due to albuterol use.  Very similar presentations to last 2 ED visits over the past few months. Pt reports having quit smoking.  She is applauded for this.  No CXR is indicated at this time for typical exacerbation.          11:30 PM Pt reports feeling improved, but not quite back to baseline.  No further wheezing, but she still feels tight and feels slightly dyspneic.  She doesn't wish to be admitted. Will give 1 more treatment in a few minutes and then see how she feels afterwards.    Gavin Pound. Dennice Tindol, MD 01/28/12 2345   12:46 AM Pt is reassured, feel improved, ok going home with refill of her inhaler  Gavin Pound. Oletta Lamas, MD 01/29/12 [phone]

## 2012-01-29 MED ORDER — ALBUTEROL SULFATE HFA 108 (90 BASE) MCG/ACT IN AERS
1.0000 | INHALATION_SPRAY | Freq: Four times a day (QID) | RESPIRATORY_TRACT | Status: DC | PRN
  Administered 2012-01-29: 2 via RESPIRATORY_TRACT
  Filled 2012-01-29: qty 6.7

## 2012-01-29 NOTE — ED Notes (Signed)
Rx given to pt. 

## 2012-02-24 ENCOUNTER — Encounter (HOSPITAL_COMMUNITY): Payer: Self-pay | Admitting: *Deleted

## 2012-02-24 ENCOUNTER — Emergency Department (HOSPITAL_COMMUNITY): Payer: Self-pay

## 2012-02-24 ENCOUNTER — Emergency Department (HOSPITAL_COMMUNITY)
Admission: EM | Admit: 2012-02-24 | Discharge: 2012-02-24 | Disposition: A | Discharge status: Home / Self Care | Payer: Self-pay | Attending: Emergency Medicine | Admitting: Emergency Medicine

## 2012-02-24 DIAGNOSIS — J45901 Unspecified asthma with (acute) exacerbation: Secondary | ICD-10-CM | POA: Insufficient documentation

## 2012-02-24 DIAGNOSIS — Z87891 Personal history of nicotine dependence: Secondary | ICD-10-CM | POA: Insufficient documentation

## 2012-02-24 DIAGNOSIS — R0609 Other forms of dyspnea: Secondary | ICD-10-CM | POA: Insufficient documentation

## 2012-02-24 DIAGNOSIS — R05 Cough: Secondary | ICD-10-CM | POA: Insufficient documentation

## 2012-02-24 DIAGNOSIS — R059 Cough, unspecified: Secondary | ICD-10-CM | POA: Insufficient documentation

## 2012-02-24 DIAGNOSIS — R0989 Other specified symptoms and signs involving the circulatory and respiratory systems: Secondary | ICD-10-CM | POA: Insufficient documentation

## 2012-02-24 MED ORDER — ALBUTEROL SULFATE HFA 108 (90 BASE) MCG/ACT IN AERS
2.0000 | INHALATION_SPRAY | Freq: Four times a day (QID) | RESPIRATORY_TRACT | Status: DC
  Administered 2012-02-24: 2 via RESPIRATORY_TRACT
  Filled 2012-02-24: qty 6.7

## 2012-02-24 MED ORDER — PREDNISONE 10 MG PO TABS: 50.0000 mg | ORAL_TABLET | Freq: Every day | ORAL | Status: AC

## 2012-02-24 MED ORDER — METHYLPREDNISOLONE SODIUM SUCC 125 MG IJ SOLR
125.0000 mg | INTRAMUSCULAR | Status: AC
  Administered 2012-02-24: 125 mg via INTRAVENOUS
  Filled 2012-02-24: qty 2

## 2012-02-24 MED ORDER — HYDROCODONE-HOMATROPINE 5-1.5 MG/5ML PO SYRP: 2.5000 mL | ORAL_SOLUTION | Freq: Four times a day (QID) | ORAL | Status: AC | PRN

## 2012-02-24 MED ORDER — ALBUTEROL SULFATE (5 MG/ML) 0.5% IN NEBU
2.5000 mg | INHALATION_SOLUTION | RESPIRATORY_TRACT | Status: AC
  Administered 2012-02-24: 2.5 mg via RESPIRATORY_TRACT
  Filled 2012-02-24: qty 0.5

## 2012-02-24 MED ORDER — IPRATROPIUM BROMIDE 0.02 % IN SOLN
0.5000 mg | RESPIRATORY_TRACT | Status: AC
  Administered 2012-02-24: 0.5 mg via RESPIRATORY_TRACT
  Filled 2012-02-24: qty 2.5

## 2012-02-24 MED ORDER — PREDNISONE 20 MG PO TABS
60.0000 mg | ORAL_TABLET | ORAL | Status: AC
  Administered 2012-02-24: 60 mg via ORAL
  Filled 2012-02-24: qty 3
  Filled 2012-02-24: qty 2

## 2012-02-24 MED ORDER — SODIUM CHLORIDE 0.9 % IV SOLN
Freq: Once | INTRAVENOUS | Status: AC
  Administered 2012-02-24: 11:00:00 via INTRAVENOUS

## 2012-02-24 NOTE — ED Provider Notes (Signed)
History     CSN: [id number]  Arrival date & time 02/24/12  0854   First MD Initiated Contact with Patient 02/24/12 [phone]      No chief complaint on file.   Patient is a 36 y.o. female presenting with asthma.  Asthma   this generally well female presents with one day of dyspnea.  She notes that in the days prior to the onset of this condition she has had a mild URI like illness.  Since yesterday, she has gradually become more dyspneic, noting that along with her cough, she has new wheezing.  She notes that multiple attempts at symptom relief with albuterol were unsuccessful.  Symptoms seemed worse in the cold weather. No fever, no vomiting, no confusion, no chest pain (though, the patient notes tightness diffusely across the anterior chest).  Past Medical History  Diagnosis Date  . Asthma     No past surgical history on file.  No family history on file.  History  Substance Use Topics  . Smoking status: Former Smoker    Types: Cigarettes  . Smokeless tobacco: Not on file   Comment: recently quit smoking  . Alcohol Use: No    OB History    Grav Para Term Preterm Abortions TAB SAB Ect Mult Living                  Review of Systems  Constitutional:       HPI  HENT:       HPI otherwise negative  Eyes: Negative.   Respiratory:       HPI, otherwise negative  Cardiovascular:       HPI, otherwise nmegative  Gastrointestinal: Negative for vomiting.  Genitourinary:       HPI, otherwise negative  Musculoskeletal:       HPI, otherwise negative  Skin: Negative.   Neurological: Negative for syncope.    Allergies  Review of patient's allergies indicates no known allergies.  Home Medications   Current Outpatient Rx  Name Route Sig Dispense Refill  . ALBUTEROL SULFATE HFA 108 (90 BASE) MCG/ACT IN AERS Inhalation Inhale 2 puffs into the lungs every 4 (four) hours as needed. For shortness of breath.    Marland Kitchen PREDNISONE 10 MG PO TABS Oral Take 2 tablets (20 mg total) by mouth  daily. 15 tablet 0    BP 121/75  Pulse 116  Temp 98.4 F (36.9 C)  Resp 20  SpO2 96%  LMP 01/24/2012  Physical Exam  Nursing note and vitals reviewed. Constitutional: She is oriented to person, place, and time. She appears well-developed and well-nourished. No distress.  HENT:  Head: Normocephalic and atraumatic.  Eyes: Conjunctivae and EOM are normal.  Cardiovascular: Regular rhythm.  Tachycardia present.   Pulmonary/Chest: No stridor. Tachypnea noted. No respiratory distress. She has wheezes in the right upper field, the right middle field, the right lower field, the left upper field, the left middle field and the left lower field.  Abdominal: She exhibits no distension.  Musculoskeletal: She exhibits no edema.  Neurological: She is alert and oriented to person, place, and time. No cranial nerve deficit.  Skin: Skin is warm and dry.  Psychiatric: She has a normal mood and affect.    ED Course  Procedures (including critical care time)  Labs Reviewed - No data to display No results found.   No diagnosis found.   CXR reviewed by me - no pna  Pulse Ox 99%-RA- normal  10:27 AM Patient has mild improvement.  DuoNeb ordered  1220PM - Patient notes a significant improvement in her condition. MDM  This generally well young female with asthma presents with dyspnea.  On exam she is in no distress though she is mildly tachycardic with wheezing.  After the provision of steroids, multiple treatments the patient was significantly better, both subjectively and objectively.  She was discharged in stable condition to follow up with her primary care physician.        Gerhard Munch, MD 02/24/12 1240

## 2012-02-24 NOTE — Discharge Instructions (Signed)

## 2012-03-01 ENCOUNTER — Telehealth: Payer: Self-pay | Admitting: Internal Medicine

## 2012-03-01 NOTE — Telephone Encounter (Signed)
Got a note from ER about her asthma though they do not specify why they are sending note to me. I have never seen her. She has seen a Dr Durwin Glaze in past and wondered if er is confusing her for me. Anyways, she has asthma. You could see if patient wants to come in to be seen by pulmonary ?

## 2012-03-08 NOTE — Telephone Encounter (Signed)
Pt set to see MR on 04-19-12 at 3:30pm. Carron Curie, CMA

## 2012-03-09 ENCOUNTER — Encounter (HOSPITAL_COMMUNITY): Payer: Self-pay | Admitting: Emergency Medicine

## 2012-03-09 ENCOUNTER — Emergency Department (HOSPITAL_COMMUNITY)
Admission: EM | Admit: 2012-03-09 | Discharge: 2012-03-09 | Disposition: A | Discharge status: Home / Self Care | Payer: Self-pay | Attending: Emergency Medicine | Admitting: Emergency Medicine

## 2012-03-09 DIAGNOSIS — J45901 Unspecified asthma with (acute) exacerbation: Secondary | ICD-10-CM | POA: Insufficient documentation

## 2012-03-09 DIAGNOSIS — R0682 Tachypnea, not elsewhere classified: Secondary | ICD-10-CM | POA: Insufficient documentation

## 2012-03-09 MED ORDER — ALBUTEROL SULFATE HFA 108 (90 BASE) MCG/ACT IN AERS
1.0000 | INHALATION_SPRAY | RESPIRATORY_TRACT | Status: DC | PRN
  Administered 2012-03-09: 1 via RESPIRATORY_TRACT
  Filled 2012-03-09: qty 6.7

## 2012-03-09 MED ORDER — ALBUTEROL SULFATE (5 MG/ML) 0.5% IN NEBU
5.0000 mg | INHALATION_SOLUTION | Freq: Once | RESPIRATORY_TRACT | Status: AC
  Administered 2012-03-09: 5 mg via RESPIRATORY_TRACT
  Filled 2012-03-09: qty 1

## 2012-03-09 MED ORDER — ALBUTEROL SULFATE (5 MG/ML) 0.5% IN NEBU
5.0000 mg | INHALATION_SOLUTION | RESPIRATORY_TRACT | Status: AC
  Administered 2012-03-09 (×2): 5 mg via RESPIRATORY_TRACT
  Filled 2012-03-09 (×2): qty 1

## 2012-03-09 MED ORDER — PREDNISONE 50 MG PO TABS: ORAL_TABLET | ORAL | Status: AC

## 2012-03-09 MED ORDER — IPRATROPIUM BROMIDE 0.02 % IN SOLN
0.5000 mg | Freq: Once | RESPIRATORY_TRACT | Status: AC
Start: 2012-03-09 — End: 2012-03-09
  Administered 2012-03-09: 0.5 mg via RESPIRATORY_TRACT
  Filled 2012-03-09: qty 2.5

## 2012-03-09 MED ORDER — PREDNISONE 20 MG PO TABS
60.0000 mg | ORAL_TABLET | Freq: Once | ORAL | Status: AC
  Administered 2012-03-09: 60 mg via ORAL
  Filled 2012-03-09: qty 3

## 2012-03-09 NOTE — ED Notes (Signed)
Case Management called to assist pt with medications.

## 2012-03-09 NOTE — Progress Notes (Signed)
ED CM consulted with ED RN. Pt is eligible for [facility] indigent program but EDP helped with another medication assistance plan for pt.  EDP provided pt with Rx for prednisone (from $[facility] list) and inhaler used in ED after respiratory treatments. EDP prefers pt to obtain advair using needymeds.com. ED Cm reviewed needymeds.com and provided patient Health connect number, list of available self pay providers, encouraged pcp for follow  Care, and provided her with pt assistance applications for Advair along with bridges to access application for assistance with cost of medications. CM and RN reviewed risks factors and contributing causes for increase in asthma s/s. Pt reports having no family hx of asthma that she can recall,  "bad allergies" as a child and having to take allergy testing, having various pets in her home, carpet in home, recent smoking and lack of exercise.  Pt encouraged to follow up with pulmonology appointment she has scheduled to assist with asthma management regimen specific for her and to work on her risk and contributing factors. Pt voiced understanding and appreciation for services and resources offered

## 2012-03-09 NOTE — ED Notes (Signed)
Pt is tearful and concerned because she cannot afford to buy the inhaler that she needs to manage her asthma, pt also does not have insurance or a PCP.

## 2012-03-09 NOTE — ED Provider Notes (Addendum)
History     CSN: [id number]  Arrival date & time 03/09/12  0905   First MD Initiated Contact with Patient 03/09/12 [phone]      Chief Complaint  Patient presents with  . Shortness of Breath    HPI The patient presents to the emergency room with complaints of shortness of breath. She states she was taking her son to school when suddenly she started feeling short of breath and began wheezing.  She did not have an inhaler with her. It had run out. She did take a couple of prednisone that she happened to have with her from a previous attack. She denies any chest pain or fevers. Her shortness of breath gets worse whenever she tries to walk around or speak in long sentences. She has history of asthma but does not currently have a primary care Dr. She is scheduled to see Dr. [facility] later this month. Patient does not smoke. She quit one year ago. She has been on Advair in the past which helped, however she cannot afford that medication right now.   Past Medical History  Diagnosis Date  . Asthma     History reviewed. No pertinent past surgical history.  History reviewed. No pertinent family history.  History  Substance Use Topics  . Smoking status: Former Smoker    Types: Cigarettes  . Smokeless tobacco: Not on file   Comment: recently quit smoking  . Alcohol Use: Yes     occasional drinker    OB History    Grav Para Term Preterm Abortions TAB SAB Ect Mult Living                  Review of Systems  All other systems reviewed and are negative.    Allergies  Review of patient's allergies indicates no known allergies.  Home Medications   Current Outpatient Rx  Name Route Sig Dispense Refill  . ALBUTEROL SULFATE HFA 108 (90 BASE) MCG/ACT IN AERS Inhalation Inhale 2 puffs into the lungs every 4 (four) hours as needed. For shortness of breath.    Marland Kitchen PREDNISONE 10 MG PO TABS Oral Take 2 tablets (20 mg total) by mouth daily. 15 tablet 0    BP 116/83  Pulse 108   Temp(Src) 97.4 F (36.3 C) (Oral)  Resp 24  Ht 5\' 4"  (1.626 m)  Wt 170 lb (77.111 kg)  BMI 29.18 kg/m2  SpO2 96%  LMP 02/24/2012  Physical Exam  Nursing note and vitals reviewed. Constitutional: She appears well-developed and well-nourished. No distress.  HENT:  Head: Normocephalic and atraumatic.  Right Ear: External ear normal.  Left Ear: External ear normal.  Eyes: Conjunctivae are normal. Right eye exhibits no discharge. Left eye exhibits no discharge. No scleral icterus.  Neck: Neck supple. No tracheal deviation present.  Cardiovascular: Normal rate, regular rhythm and intact distal pulses.   Pulmonary/Chest: Accessory muscle usage present. No stridor. Tachypnea noted. No respiratory distress. She has wheezes. She has no rales.       Able to speak in full sentences  Abdominal: Soft. Bowel sounds are normal. She exhibits no distension. There is no tenderness. There is no rebound and no guarding.  Musculoskeletal: She exhibits no edema and no tenderness.  Neurological: She is alert. She has normal strength. No sensory deficit. Cranial nerve deficit:  no gross defecits noted. She exhibits normal muscle tone. She displays no seizure activity. Coordination normal.  Skin: Skin is warm and dry. No rash noted.  Psychiatric: She  has a normal mood and affect.    ED Course  Procedures (including critical care time)  Medications  guaiFENesin (MUCINEX) 600 MG 12 hr tablet (not administered)  predniSONE (DELTASONE) 10 MG tablet (not administered)  HYDROcodone-homatropine (HYCODAN) 5-1.5 MG/5ML syrup (not administered)  albuterol (PROVENTIL) (5 MG/ML) 0.5% nebulizer solution 5 mg (5 mg Nebulization Not Given 03/09/12 1200)  albuterol (PROVENTIL) (5 MG/ML) 0.5% nebulizer solution 5 mg (5 mg Nebulization Given 03/09/12 0949)  ipratropium (ATROVENT) nebulizer solution 0.5 mg (0.5 mg Nebulization Given 03/09/12 0949)  predniSONE (DELTASONE) tablet 60 mg (60 mg Oral Given 03/09/12 0944)    Labs  Reviewed - No data to display No results found.   No diagnosis found.    MDM  The patient is feeling much better after several albuterol treatments. She has also been given oral steroids. On exam at this time she's not having any wheezing and is able speak in full sentences. Patient be discharged home with an albuterol inhaler and steroids. I will give her a discharge instructions sheet and will give her references as to help find a primary care physician        Celene Kras, MD 03/09/12 1155  Celene Kras, MD 03/09/12 [phone]

## 2012-03-09 NOTE — ED Notes (Addendum)
Pt with h/o asthma, pt states she has had a cold for several days. This am, pt took her daughter to school and became extremely SOB.  Pt c/o tightness in chest but denies pain.  Pt out of inhalers for asthma. Pt did take 2 10mg  Prednisone that she was given at her last visit.

## 2012-03-09 NOTE — Discharge Instructions (Signed)
Asthma Attack Prevention HOW CAN ASTHMA BE PREVENTED? Currently, there is no way to prevent asthma from starting. However, you can take steps to control the disease and prevent its symptoms after you have been diagnosed. Learn about your asthma and how to control it. Take an active role to control your asthma by working with your caregiver to create and follow an asthma action plan. An asthma action plan guides you in taking your medicines properly, avoiding factors that make your asthma worse, tracking your level of asthma control, responding to worsening asthma, and seeking emergency care when needed. To track your asthma, keep records of your symptoms, check your peak flow number using a peak flow meter (handheld device that shows how well air moves out of your lungs), and get regular asthma checkups.  Other ways to prevent asthma attacks include:  Use medicines as your caregiver directs.   Identify and avoid things that make your asthma worse (as much as you can).   Keep track of your asthma symptoms and level of control.   Get regular checkups for your asthma.   With your caregiver, write a detailed plan for taking medicines and managing an asthma attack. Then be sure to follow your action plan. Asthma is an ongoing condition that needs regular monitoring and treatment.   Identify and avoid asthma triggers. A number of outdoor allergens and irritants (pollen, mold, cold air, air pollution) can trigger asthma attacks. Find out what causes or makes your asthma worse, and take steps to avoid those triggers (see below).   Monitor your breathing. Learn to recognize warning signs of an attack, such as slight coughing, wheezing or shortness of breath. However, your lung function may already decrease before you notice any signs or symptoms, so regularly measure and record your peak airflow with a home peak flow meter.   Identify and treat attacks early. If you act quickly, you're less likely to have  a severe attack. You will also need less medicine to control your symptoms. When your peak flow measurements decrease and alert you to an upcoming attack, take your medicine as instructed, and immediately stop any activity that may have triggered the attack. If your symptoms do not improve, get medical help.   Pay attention to increasing quick-relief inhaler use. If you find yourself relying on your quick-relief inhaler (such as albuterol), your asthma is not under control. See your caregiver about adjusting your treatment.  IDENTIFY AND CONTROL FACTORS THAT MAKE YOUR ASTHMA WORSE A number of common things can set off or make your asthma symptoms worse (asthma triggers). Keep track of your asthma symptoms for several weeks, detailing all the environmental and emotional factors that are linked with your asthma. When you have an asthma attack, go back to your asthma diary to see which factor, or combination of factors, might have contributed to it. Once you know what these factors are, you can take steps to control many of them.  Allergies: If you have allergies and asthma, it is important to take asthma prevention steps at home. Asthma attacks (worsening of asthma symptoms) can be triggered by allergies, which can cause temporary increased inflammation of your airways. Minimizing contact with the substance to which you are allergic will help prevent an asthma attack. Animal Dander:   Some people are allergic to the flakes of skin or dried saliva from animals with fur or feathers. Keep these pets out of your home.   If you can't keep a pet outdoors, keep the   pet out of your bedroom and other sleeping areas at all times, and keep the door closed.   Remove carpets and furniture covered with cloth from your home. If that is not possible, keep the pet away from fabric-covered furniture and carpets.  Dust Mites:  Many people with asthma are allergic to dust mites. Dust mites are tiny bugs that are found in  every home, in mattresses, pillows, carpets, fabric-covered furniture, bedcovers, clothes, stuffed toys, fabric, and other fabric-covered items.   Cover your mattress in a special dust-proof cover.   Cover your pillow in a special dust-proof cover, or wash the pillow each week in hot water. Water must be hotter than 130 F to kill dust mites. Cold or warm water used with detergent and bleach can also be effective.   Wash the sheets and blankets on your bed each week in hot water.   Try not to sleep or lie on cloth-covered cushions.   Call ahead when traveling and ask for a smoke-free hotel room. Bring your own bedding and pillows, in case the hotel only supplies feather pillows and down comforters, which may contain dust mites and cause asthma symptoms.   Remove carpets from your bedroom and those laid on concrete, if you can.   Keep stuffed toys out of the bed, or wash the toys weekly in hot water or cooler water with detergent and bleach.  Cockroaches:  Many people with asthma are allergic to the droppings and remains of cockroaches.   Keep food and garbage in closed containers. Never leave food out.   Use poison baits, traps, powders, gels, or paste (for example, boric acid).   If a spray is used to kill cockroaches, stay out of the room until the odor goes away.  Indoor Mold:  Fix leaky faucets, pipes, or other sources of water that have mold around them.   Clean moldy surfaces with a cleaner that has bleach in it.  Pollen and Outdoor Mold:  When pollen or mold spore counts are high, try to keep your windows closed.   Stay indoors with windows closed from late morning to afternoon, if you can. Pollen and some mold spore counts are highest at that time.   Ask your caregiver whether you need to take or increase anti-inflammatory medicine before your allergy season starts.  Irritants:   Tobacco smoke is an irritant. If you smoke, ask your caregiver how you can quit. Ask family  members to quit smoking, too. Do not allow smoking in your home or car.   If possible, do not use a wood-burning stove, kerosene heater, or fireplace. Minimize exposure to all sources of smoke, including incense, candles, fires, and fireworks.   Try to stay away from strong odors and sprays, such as perfume, talcum powder, hair spray, and paints.   Decrease humidity in your home and use an indoor air cleaning device. Reduce indoor humidity to below 60 percent. Dehumidifiers or central air conditioners can do this.   Try to have someone else vacuum for you once or twice a week, if you can. Stay out of rooms while they are being vacuumed and for a short while afterward.   If you vacuum, use a dust mask from a hardware store, a double-layered or microfilter vacuum cleaner bag, or a vacuum cleaner with a HEPA filter.   Sulfites in foods and beverages can be irritants. Do not drink beer or wine, or eat dried fruit, processed potatoes, or shrimp if they cause asthma   symptoms.   Cold air can trigger an asthma attack. Cover your nose and mouth with a scarf on cold or windy days.   Several health conditions can make asthma more difficult to manage, including runny nose, sinus infections, reflux disease, psychological stress, and sleep apnea. Your caregiver will treat these conditions, as well.   Avoid close contact with people who have a cold or the flu, since your asthma symptoms may get worse if you catch the infection from them. Wash your hands thoroughly after touching items that may have been handled by people with a respiratory infection.   Get a flu shot every year to protect against the flu virus, which often makes asthma worse for days or weeks. Also get a pneumonia shot once every five to 10 years.  Drugs:  Aspirin and other painkillers can cause asthma attacks. 10% to 20% of people with asthma have sensitivity to aspirin or a group of painkillers called non-steroidal anti-inflammatory drugs  (NSAIDS), such as ibuprofen and naproxen. These drugs are used to treat pain and reduce fevers. Asthma attacks caused by any of these medicines can be severe and even fatal. These drugs must be avoided in people who have known aspirin sensitive asthma. Products with acetaminophen are considered safe for people who have asthma. It is important that people with aspirin sensitivity read labels of all over-the-counter drugs used to treat pain, colds, coughs, and fever.   Beta blockers and ACE inhibitors are other drugs which you should discuss with your caregiver, in relation to your asthma.  ALLERGY SKIN TESTING  Ask your asthma caregiver about allergy skin testing or blood testing (RAST test) to identify the allergens to which you are sensitive. If you are found to have allergies, allergy shots (immunotherapy) for asthma may help prevent future allergies and asthma. With allergy shots, small doses of allergens (substances to which you are allergic) are injected under your skin on a regular schedule. Over a period of time, your body may become used to the allergen and less responsive with asthma symptoms. You can also take measures to minimize your exposure to those allergens. EXERCISE  If you have exercise-induced asthma, or are planning vigorous exercise, or exercise in cold, humid, or dry environments, prevent exercise-induced asthma by following your caregiver's advice regarding asthma treatment before exercising. Document Released: 12/01/2009 Document Revised: 12/02/2011 Document Reviewed: 12/01/2009 [employer] Patient Information 2012 [employer]. 

## 2012-04-19 ENCOUNTER — Ambulatory Visit (INDEPENDENT_AMBULATORY_CARE_PROVIDER_SITE_OTHER): Payer: Self-pay | Admitting: Internal Medicine

## 2012-04-19 ENCOUNTER — Encounter: Payer: Self-pay | Admitting: Internal Medicine

## 2012-04-19 VITALS — BP 100/68 | HR 85 | Temp 98.1°F | Ht 64.0 in | Wt 179.6 lb

## 2012-04-19 DIAGNOSIS — J45909 Unspecified asthma, uncomplicated: Secondary | ICD-10-CM

## 2012-04-19 DIAGNOSIS — IMO0001 Reserved for inherently not codable concepts without codable children: Secondary | ICD-10-CM | POA: Insufficient documentation

## 2012-04-19 DIAGNOSIS — J4540 Moderate persistent asthma, uncomplicated: Secondary | ICD-10-CM

## 2012-04-19 DIAGNOSIS — F17200 Nicotine dependence, unspecified, uncomplicated: Secondary | ICD-10-CM | POA: Insufficient documentation

## 2012-04-19 DIAGNOSIS — Z9109 Other allergy status, other than to drugs and biological substances: Secondary | ICD-10-CM | POA: Insufficient documentation

## 2012-04-19 DIAGNOSIS — J301 Allergic rhinitis due to pollen: Secondary | ICD-10-CM

## 2012-04-19 MED ORDER — ALBUTEROL SULFATE (2.5 MG/3ML) 0.083% IN NEBU: 2.5000 mg | INHALATION_SOLUTION | Freq: Four times a day (QID) | RESPIRATORY_TRACT | Status: DC | PRN

## 2012-04-19 MED ORDER — ALBUTEROL SULFATE HFA 108 (90 BASE) MCG/ACT IN AERS: 2.0000 | INHALATION_SPRAY | RESPIRATORY_TRACT | Status: DC | PRN

## 2012-04-19 MED ORDER — FLUTICASONE PROPIONATE 50 MCG/ACT NA SUSP: 1.0000 | Freq: Every day | NASAL | Status: DC

## 2012-04-19 NOTE — Progress Notes (Signed)
Subjective:    Patient ID: Kelly Wang, female    DOB: 04-08-1976, 36 y.o.   MRN: [id number]  HPI  IOV 04/19/2012   36 year old female.  Body mass index is 30.83 kg/(m^2). Uninsured.  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 Reports that she has been smoking Cigarettes.  She has a 1.8 pack-year smoking history. Started smoking at age 13s. Quit at age 39 but restarted agge 9. [place] smoking 1 pack per week. Now smoking 2 cig per day.  Grandfather died of emphysema. Had "bad" allergies a as child.    Went to ER and referred here for "asthma"  Says she has "adult onset asthma". Insidious onset 2 years ago. AT onset recollects going to ER for dyspnea at time of diagnosis.   No associated pmd and therefore says controller medications lacking. Symptoms of asthma are cough, chest tightness, dyspnea, white mucus, wheezing and raspy voice. Associated pollen induced allergies +. Symptoms are episodic. Symptoms worsened by weather change esp cold or hot, stress, cigarettes, emotions, pollen Sometimes hair spray can make things worse but not usually. Symptoms improved by albuterol. 2 years ago took advair and it helped. Uses albuterol rescue at baseline 2-3 times per day. Significant nocturnal awakenings present due to symptoms - waking up daily past several months.  Currently with advair sample  that helped but ran out of it few days ago and feels symptoms worsening past 2 days.  Prednisone always helps. She has numerous ER visits, and prednisone burst (says 25-50 times past 2 years). 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 Used to have [place] pigs but now one dog (recollects asthma dx at time of getting [place] pig). No cats. No birds. No roaches.    Spirometry today - fev1 1.96L/75%, Ratio 61 - c/w mild- moderate obstruction  cxr 02/24/12- hyperinflated      Past Medical History  Diagnosis Date  . Asthma      Family History  Problem Relation Age of Onset  . Emphysema Maternal Grandfather   .  Heart disease Maternal Grandmother   . Diabetes Maternal Grandmother   . Kidney failure Maternal Grandmother      History   Social History  . Marital Status: Single    Spouse Name: N/A    Number of Children: N/A  . Years of Education: N/A   Occupational History  . hairdresser    Social History Main Topics  . Smoking status: Current Some Day Smoker -- 0.3 packs/day for 6 years    Types: Cigarettes  . Smokeless tobacco: Not on file   Comment: pt states she smokes a signal cig occasionally a month  . Alcohol Use: Yes     occasional drinker  . Drug Use: No  . Sexually Active: Not on file   Other Topics Concern  . Not on file   Social History Narrative  . No narrative on file     No Known Allergies   Outpatient Prescriptions Prior to Visit  Medication Sig Dispense Refill  . albuterol (PROVENTIL HFA;VENTOLIN HFA) 108 (90 BASE) MCG/ACT inhaler Inhale 2 puffs into the lungs every 4 (four) hours as needed. For shortness of breath.      . guaiFENesin (MUCINEX) 600 MG 12 hr tablet Take 1,200 mg by mouth 2 (two) times daily.      . predniSONE (DELTASONE) 10 MG tablet Take 20 mg by mouth daily as needed. As needed for shortness of breath or wheezing      .  HYDROcodone-homatropine (HYCODAN) 5-1.5 MG/5ML syrup Take 5 mLs by mouth every 6 (six) hours as needed. For cough           Review of Systems  Constitutional: Negative for fever and unexpected weight change.  HENT: Positive for congestion. Negative for ear pain, nosebleeds, sore throat, rhinorrhea, sneezing, trouble swallowing, dental problem, postnasal drip and sinus pressure.   Eyes: Negative for redness and itching.  Respiratory: Positive for cough and shortness of breath. Negative for chest tightness and wheezing.   Cardiovascular: Negative for palpitations and leg swelling.  Gastrointestinal: Negative for nausea and vomiting.  Genitourinary: Negative for dysuria.  Musculoskeletal: Negative for joint swelling.  Skin:  Negative for rash.  Neurological: Negative for headaches.  Hematological: Does not bruise/bleed easily.  Psychiatric/Behavioral: Negative for dysphoric mood. The patient is not nervous/anxious.        Objective:   Physical Exam  Vitals reviewed. Constitutional: She is oriented to person, place, and time. She appears well-developed and well-nourished. No distress.       Body mass index is 30.83 kg/(m^2).   HENT:  Head: Normocephalic and atraumatic.  Right Ear: External ear normal.  Left Ear: External ear normal.  Mouth/Throat: Oropharynx is clear and moist. No oropharyngeal exudate.  Eyes: Conjunctivae and EOM are normal. Pupils are equal, round, and reactive to light. Right eye exhibits no discharge. Left eye exhibits no discharge. No scleral icterus.  Neck: Normal range of motion. Neck supple. No JVD present. No tracheal deviation present. No thyromegaly present.  Cardiovascular: Normal rate, regular rhythm, normal heart sounds and intact distal pulses.  Exam reveals no gallop and no friction rub.   No murmur heard. Pulmonary/Chest: Effort normal and breath sounds normal. No respiratory distress. She has no wheezes. She has no rales. She exhibits no tenderness.  Abdominal: Soft. Bowel sounds are normal. She exhibits no distension and no mass. There is no tenderness. There is no rebound and no guarding.  Musculoskeletal: Normal range of motion. She exhibits no edema and no tenderness.  Lymphadenopathy:    She has no cervical adenopathy.  Neurological: She is alert and oriented to person, place, and time. She has normal reflexes. No cranial nerve deficit. She exhibits normal muscle tone. Coordination normal.  Skin: Skin is warm and dry. No rash noted. She is not diaphoretic. No erythema. No pallor.  Psychiatric: She has a normal mood and affect. Her behavior is normal. Judgment and thought content normal.          Assessment & Plan:

## 2012-04-19 NOTE — Patient Instructions (Addendum)
#  Moderate ASthma  - please use advair 250/50 1 puff twice daily; several samples given.   - do  Not forget your advair  - use albuterol as needed  - nurse will see if you can get [facility]  - at followup do spirometry and we can discuss genetic testing and allergy testing but now focus is to get you to feel better  #Smoking  - -please work on quitting completely; we can talk more at followup. Quitting will improve your asthma  #Allergies  - take sample nasal steroid and prescription  take generic fluticasone inhaler 2 squirts each nostril daily  #Followup   6 weeks with spirometry  call us [phone] if not well  compliance with Rx important

## 2012-04-25 ENCOUNTER — Encounter: Payer: Self-pay | Admitting: Internal Medicine

## 2012-04-25 NOTE — Assessment & Plan Note (Signed)
#  Moderate ASthma  - please use advair 250/50 1 puff twice daily  - do  Not forget your advair  - use albuterol as needed  - nurse will see if you can get set [facility] at [facility]  - at followup do spirometry and we can discuss genetic testing and allergy testing but now focus is to get you to feel better

## 2012-04-25 NOTE — Assessment & Plan Note (Signed)
#  Smoking  - -please work on quitting completely; we can talk more at followup. Quitting will improve your asthma  

## 2012-04-25 NOTE — Assessment & Plan Note (Signed)
 #  Allergies  - take sample nasal steroid and prescription  take generic fluticasone inhaler 2 squirts each nostril daily  #Followup   6 weeks with spirometry  call us [phone] if not well  compliance with Rx important

## 2012-05-31 ENCOUNTER — Telehealth: Payer: Self-pay | Admitting: Internal Medicine

## 2012-05-31 NOTE — Telephone Encounter (Signed)
Pt was given 2 samples of advair 250-50

## 2012-06-19 ENCOUNTER — Telehealth: Payer: Self-pay | Admitting: Internal Medicine

## 2012-06-19 MED ORDER — FLUTICASONE-SALMETEROL 250-50 MCG/DOSE IN AEPB: 1.0000 | INHALATION_SPRAY | Freq: Two times a day (BID) | RESPIRATORY_TRACT | Status: DC

## 2012-06-19 NOTE — Telephone Encounter (Signed)
Pt given 1 sample of advair 250/50.  Also, would like advair rx sent to [facility] on [address].  This has been done and pt aware.

## 2012-10-23 ENCOUNTER — Encounter (HOSPITAL_COMMUNITY): Payer: Self-pay | Admitting: *Deleted

## 2012-10-23 ENCOUNTER — Emergency Department (HOSPITAL_COMMUNITY)
Admission: EM | Admit: 2012-10-23 | Discharge: 2012-10-24 | Disposition: A | Discharge status: Home / Self Care | Payer: Self-pay | Attending: Emergency Medicine | Admitting: Emergency Medicine

## 2012-10-23 DIAGNOSIS — Z79899 Other long term (current) drug therapy: Secondary | ICD-10-CM | POA: Insufficient documentation

## 2012-10-23 DIAGNOSIS — F17200 Nicotine dependence, unspecified, uncomplicated: Secondary | ICD-10-CM | POA: Insufficient documentation

## 2012-10-23 DIAGNOSIS — J45909 Unspecified asthma, uncomplicated: Secondary | ICD-10-CM | POA: Insufficient documentation

## 2012-10-23 DIAGNOSIS — Z760 Encounter for issue of repeat prescription: Secondary | ICD-10-CM | POA: Insufficient documentation

## 2012-10-23 DIAGNOSIS — IMO0002 Reserved for concepts with insufficient information to code with codable children: Secondary | ICD-10-CM | POA: Insufficient documentation

## 2012-10-23 NOTE — ED Notes (Signed)
[id number]<QI> Expected date:10/23/12<BR> Expected time: 9:56 PM<BR> Means of arrival:Ambulance<BR> Comments:<BR> resp distress

## 2012-10-23 NOTE — ED Notes (Signed)
Per EMS pt c/o resp distress starting about 3 days ago; gradually getting worse; ran out of home albuterol; presents with inspiratory/expiratory wheezing; albuterol 10mg /atrovent 0.5mg ; solumedrol 125mg  given en route

## 2012-10-24 MED ORDER — ALBUTEROL SULFATE HFA 108 (90 BASE) MCG/ACT IN AERS
2.0000 | INHALATION_SPRAY | RESPIRATORY_TRACT | Status: DC | PRN
  Administered 2012-10-24: 2 via RESPIRATORY_TRACT
  Filled 2012-10-24: qty 6.7

## 2012-10-24 MED ORDER — FLUTICASONE-SALMETEROL 250-50 MCG/DOSE IN AEPB
1.0000 | INHALATION_SPRAY | Freq: Two times a day (BID) | RESPIRATORY_TRACT | Status: DC
  Administered 2012-10-24: 1 via RESPIRATORY_TRACT
  Filled 2012-10-24: qty 14

## 2012-10-24 NOTE — ED Provider Notes (Signed)
Medical screening examination/treatment/procedure(s) were performed by non-physician practitioner and as supervising physician I was immediately available for consultation/collaboration.  Marquesa Rath M Keimya Briddell, MD 10/24/12 0420 

## 2012-10-24 NOTE — ED Provider Notes (Addendum)
History     CSN: [id number]  Arrival date & time 10/23/12  2218   First MD Initiated Contact with Patient 10/23/12 2314      Chief Complaint  Patient presents with  . Shortness of Breath    (Consider location/radiation/quality/duration/timing/severity/associated sxs/prior treatment) HPI Comments: Ran out of her inhaler  The history is provided by the patient.    Past Medical History  Diagnosis Date  . Asthma     Past Surgical History  Procedure Date  . Breast surgery     Family History  Problem Relation Age of Onset  . Emphysema Maternal Grandfather   . Heart disease Maternal Grandmother   . Diabetes Maternal Grandmother   . Kidney failure Maternal Grandmother     History  Substance Use Topics  . Smoking status: Current Some Day Smoker -- 0.3 packs/day for 6 years    Types: Cigarettes  . Smokeless tobacco: Not on file   Comment: pt states she smokes a signal cig occasionally a month  . Alcohol Use: Yes     occasional drinker    OB History    Grav Para Term Preterm Abortions TAB SAB Ect Mult Living                  Review of Systems  Constitutional: Negative for fever.  HENT: Negative.   Respiratory: Positive for shortness of breath and wheezing.   Cardiovascular: Negative for chest pain.  Gastrointestinal: Negative.   Genitourinary: Negative.   Neurological: Negative for dizziness.    Allergies  Review of patient's allergies indicates no known allergies.  Home Medications   Current Outpatient Rx  Name Route Sig Dispense Refill  . ALBUTEROL SULFATE HFA 108 (90 BASE) MCG/ACT IN AERS Inhalation Inhale 2 puffs into the lungs every 4 (four) hours as needed. For shortness of breath. 1 Inhaler 2  . ALBUTEROL SULFATE (2.5 MG/3ML) 0.083% IN NEBU Nebulization Take 3 mLs (2.5 mg total) by nebulization every 6 (six) hours as needed for wheezing. 75 mL 12  . FLUTICASONE-SALMETEROL 250-50 MCG/DOSE IN AEPB Inhalation Inhale 1 puff into the lungs every 12  (twelve) hours. 60 each 3  . LORATADINE 10 MG PO TABS Oral Take 10 mg by mouth daily as needed. For allergies    . GUAIFENESIN ER 600 MG PO TB12 Oral Take 1,200 mg by mouth 2 (two) times daily as needed. For congestion    . PREDNISONE 10 MG PO TABS Oral Take 20 mg by mouth daily as needed. As needed for shortness of breath or wheezing      BP 99/64  Temp 98.1 F (36.7 C) (Oral)  Resp 17  SpO2 100%  Physical Exam  Constitutional: She appears well-developed and well-nourished.  Neck: Normal range of motion.  Cardiovascular: Normal rate.   Pulmonary/Chest: Effort normal. No respiratory distress. She has wheezes.  Abdominal: Soft.  Neurological: She is alert.  Skin: Skin is warm.    ED Course  Procedures (including critical care time)  Labs Reviewed - No data to display No results found.   1. Asthma   2. Medication refill       MDM   Patient monitored for several hours.  There is no rebounding of her asthma symptoms.  She has been provided with an albuterol inhaler, as well as an Advair inhaler, and hasn't been encouraged to follow up with her primary care physician for continued regular.  Treatment of her chronic illness        Dondra Spry  Wynona Luna, NP 10/24/12 0150  Arman Filter, NP 11/06/12 [phone]

## 2012-10-24 NOTE — ED Notes (Signed)
Dr Otter at bedside  

## 2012-10-24 NOTE — ED Notes (Signed)
Patient is alert and oriented x3.  She was given DC instructions and follow up visit instructions.  Patient gave verbal understanding. She was DC ambulatory under his own power to home.  V/S stable.  He was not showing any signs of distress on DC 

## 2012-11-06 NOTE — ED Provider Notes (Signed)
Medical screening examination/treatment/procedure(s) were performed by non-physician practitioner and as supervising physician I was immediately available for consultation/collaboration.  Olivia Mackie, MD 11/06/12 [phone]

## 2012-11-18 ENCOUNTER — Encounter (HOSPITAL_COMMUNITY): Payer: Self-pay | Admitting: Emergency Medicine

## 2012-11-18 ENCOUNTER — Emergency Department (HOSPITAL_COMMUNITY)
Admission: EM | Admit: 2012-11-18 | Discharge: 2012-11-18 | Disposition: A | Discharge status: Home / Self Care | Payer: Self-pay | Attending: Emergency Medicine | Admitting: Emergency Medicine

## 2012-11-18 DIAGNOSIS — R05 Cough: Secondary | ICD-10-CM | POA: Insufficient documentation

## 2012-11-18 DIAGNOSIS — Z79899 Other long term (current) drug therapy: Secondary | ICD-10-CM | POA: Insufficient documentation

## 2012-11-18 DIAGNOSIS — J45909 Unspecified asthma, uncomplicated: Secondary | ICD-10-CM

## 2012-11-18 DIAGNOSIS — J45901 Unspecified asthma with (acute) exacerbation: Secondary | ICD-10-CM | POA: Insufficient documentation

## 2012-11-18 DIAGNOSIS — F17200 Nicotine dependence, unspecified, uncomplicated: Secondary | ICD-10-CM | POA: Insufficient documentation

## 2012-11-18 DIAGNOSIS — R062 Wheezing: Secondary | ICD-10-CM | POA: Insufficient documentation

## 2012-11-18 DIAGNOSIS — R0602 Shortness of breath: Secondary | ICD-10-CM | POA: Insufficient documentation

## 2012-11-18 DIAGNOSIS — R059 Cough, unspecified: Secondary | ICD-10-CM | POA: Insufficient documentation

## 2012-11-18 MED ORDER — ALBUTEROL SULFATE (5 MG/ML) 0.5% IN NEBU
5.0000 mg | INHALATION_SOLUTION | Freq: Once | RESPIRATORY_TRACT | Status: AC
  Administered 2012-11-18: 5 mg via RESPIRATORY_TRACT
  Filled 2012-11-18: qty 1

## 2012-11-18 MED ORDER — AZITHROMYCIN 250 MG PO TABS: 250.0000 mg | ORAL_TABLET | Freq: Every day | ORAL | Status: DC

## 2012-11-18 MED ORDER — ALBUTEROL SULFATE HFA 108 (90 BASE) MCG/ACT IN AERS
2.0000 | INHALATION_SPRAY | Freq: Once | RESPIRATORY_TRACT | Status: AC
  Administered 2012-11-18: 2 via RESPIRATORY_TRACT
  Filled 2012-11-18: qty 6.7

## 2012-11-18 MED ORDER — HYDROCOD POLST-CHLORPHEN POLST 10-8 MG/5ML PO LQCR
5.0000 mL | Freq: Once | ORAL | Status: AC
  Administered 2012-11-18: 5 mL via ORAL
  Filled 2012-11-18: qty 5

## 2012-11-18 MED ORDER — IPRATROPIUM BROMIDE 0.02 % IN SOLN
0.5000 mg | Freq: Once | RESPIRATORY_TRACT | Status: AC
  Administered 2012-11-18: 0.5 mg via RESPIRATORY_TRACT
  Filled 2012-11-18: qty 2.5

## 2012-11-18 MED ORDER — PREDNISONE 20 MG PO TABS: 60.0000 mg | ORAL_TABLET | Freq: Every day | ORAL | Status: AC

## 2012-11-18 MED ORDER — HYDROCODONE-ACETAMINOPHEN 7.5-500 MG/15ML PO SOLN: 15.0000 mL | Freq: Four times a day (QID) | ORAL | Status: DC | PRN

## 2012-11-18 NOTE — ED Notes (Signed)
Per EMS pt has asthma  EMS was called out earlier tonight for same and gave a breathing tx of albuterol 5mg  at that time  Pt refused transport at that time  Called out again this am and pt was diaphoretic and had labored breathing with wheezing throughout lung fields  Pt transported here  Given albuterol 5mg , solumedrol 125mg  IV  Pt has an IV in left forearm with a #20 with 500 cc of NS infusing

## 2012-11-18 NOTE — ED Provider Notes (Signed)
Medical screening examination/treatment/procedure(s) were performed by non-physician practitioner and as supervising physician I was immediately available for consultation/collaboration.  Juliet Rude. Rubin Payor, MD 11/18/12 2053

## 2012-11-18 NOTE — ED Notes (Signed)
Pt alert and oriented x4. Respirations even and unlabored, bilateral symmetrical rise and fall of chest. Skin warm and dry. In no acute distress. Denies needs.   

## 2012-11-18 NOTE — ED Provider Notes (Signed)
History     CSN: [id number]  Arrival date & time 11/18/12  0608   First MD Initiated Contact with Patient 11/18/12 0615      Chief Complaint  Patient presents with  . Asthma    (Consider location/radiation/quality/duration/timing/severity/associated sxs/prior treatment) Patient is a 36 y.o. female presenting with asthma. The history is provided by the patient.  Asthma This is a new problem. The current episode started yesterday. The problem occurs constantly. The problem has been gradually worsening. Associated symptoms include coughing. Pertinent negatives include no chest pain, chills, fever, myalgias, nausea, rash, sore throat or vomiting. Associated symptoms comments: History of asthma with exacerbation of wheezing starting yesterday. She currently uses inhalers and nebulizer treatments at home but is out of Rx's. No pain, fever. No N, V. .    Past Medical History  Diagnosis Date  . Asthma     Past Surgical History  Procedure Date  . Breast surgery     Family History  Problem Relation Age of Onset  . Emphysema Maternal Grandfather   . Heart disease Maternal Grandmother   . Diabetes Maternal Grandmother   . Kidney failure Maternal Grandmother     History  Substance Use Topics  . Smoking status: Current Some Day Smoker -- 0.3 packs/day for 6 years    Types: Cigarettes  . Smokeless tobacco: Not on file     Comment: pt states she smokes a signal cig occasionally a month  . Alcohol Use: Yes     Comment: occasional drinker    OB History    Grav Para Term Preterm Abortions TAB SAB Ect Mult Living                  Review of Systems  Constitutional: Negative for fever and chills.  HENT: Negative.  Negative for sore throat and rhinorrhea.   Respiratory: Positive for cough, shortness of breath and wheezing.   Cardiovascular: Negative for chest pain.  Gastrointestinal: Negative.  Negative for nausea and vomiting.  Musculoskeletal: Negative.  Negative for myalgias.   Skin: Negative.  Negative for rash.  Neurological: Negative.  Negative for dizziness.    Allergies  Review of patient's allergies indicates no known allergies.  Home Medications   Current Outpatient Rx  Name  Route  Sig  Dispense  Refill  . ALBUTEROL SULFATE HFA 108 (90 BASE) MCG/ACT IN AERS   Inhalation   Inhale 2 puffs into the lungs every 4 (four) hours as needed. For shortness of breath.   1 Inhaler   2   . ALBUTEROL SULFATE (2.5 MG/3ML) 0.083% IN NEBU   Nebulization   Take 3 mLs (2.5 mg total) by nebulization every 6 (six) hours as needed for wheezing.   75 mL   12   . FLUTICASONE-SALMETEROL 250-50 MCG/DOSE IN AEPB   Inhalation   Inhale 1 puff into the lungs every 12 (twelve) hours.   60 each   3   . GUAIFENESIN ER 600 MG PO TB12   Oral   Take 1,200 mg by mouth 2 (two) times daily as needed. For congestion         . LORATADINE 10 MG PO TABS   Oral   Take 10 mg by mouth daily as needed. For allergies           BP 103/69  Pulse 97  Temp 98.5 F (36.9 C) (Oral)  Resp 20  SpO2 100%  Physical Exam  Constitutional: She is oriented to person, place, and time.  She appears well-developed and well-nourished.  HENT:  Head: Normocephalic.  Neck: Normal range of motion. Neck supple.  Cardiovascular: Normal rate and regular rhythm.   No murmur heard. Pulmonary/Chest: Effort normal. No respiratory distress. She has wheezes. She has rales. She exhibits no tenderness.  Abdominal: Soft. Bowel sounds are normal. There is no tenderness. There is no rebound and no guarding.  Musculoskeletal: Normal range of motion. She exhibits no edema.  Neurological: She is alert and oriented to person, place, and time.  Skin: Skin is warm and dry. No rash noted.  Psychiatric: She has a normal mood and affect.    ED Course  Procedures (including critical care time)  Labs Reviewed - No data to display No results found.   No diagnosis found.  1. Asthmatic  bronchitis   MDM  Better after 2 additional treatments in ED - after one neb given in route. Solumedrol given. Tussionex improves cough. Patient stable for discharge.         Rodena Medin, PA-C 11/18/12 [phone]

## 2012-11-18 NOTE — ED Notes (Signed)
Pt escorted to discharge window. Pt verbalized understanding discharge instructions. In no acute distress.  

## 2013-04-24 ENCOUNTER — Other Ambulatory Visit: Payer: Self-pay | Admitting: Internal Medicine

## 2013-06-20 ENCOUNTER — Telehealth: Payer: Self-pay | Admitting: Internal Medicine

## 2013-06-20 MED ORDER — ALBUTEROL SULFATE (2.5 MG/3ML) 0.083% IN NEBU: 2.5000 mg | INHALATION_SOLUTION | Freq: Four times a day (QID) | RESPIRATORY_TRACT | Status: DC | PRN

## 2013-06-20 MED ORDER — FLUTICASONE-SALMETEROL 250-50 MCG/DOSE IN AEPB: 1.0000 | INHALATION_SPRAY | Freq: Two times a day (BID) | RESPIRATORY_TRACT | Status: DC

## 2013-06-20 NOTE — Telephone Encounter (Signed)
Spoke with pt.  She is in between pay periods at work and out of advair.  She is requesting a sample of this to hold her until she gets paid and would like a rx sent to [facility] for Albuterol neb. She hasn't been seen since 04/19/12 with MR and has no pending OV. I have provided pt with 1 sample of adviar 250 and sent rx for albuterol to pharm. Pt has scheduled OV with MR for July.  She is aware to keep this appt for further rxs and to call back if anything needed prior to this appt. She verbalized understanding and voiced no further questions or concerns at this time.

## 2013-07-08 ENCOUNTER — Emergency Department (HOSPITAL_COMMUNITY)
Admission: EM | Admit: 2013-07-08 | Discharge: 2013-07-08 | Disposition: A | Discharge status: Home / Self Care | Payer: Self-pay | Attending: Emergency Medicine | Admitting: Emergency Medicine

## 2013-07-08 ENCOUNTER — Encounter (HOSPITAL_COMMUNITY): Payer: Self-pay

## 2013-07-08 DIAGNOSIS — J45909 Unspecified asthma, uncomplicated: Secondary | ICD-10-CM | POA: Insufficient documentation

## 2013-07-08 DIAGNOSIS — F17200 Nicotine dependence, unspecified, uncomplicated: Secondary | ICD-10-CM | POA: Insufficient documentation

## 2013-07-08 DIAGNOSIS — M545 Low back pain, unspecified: Secondary | ICD-10-CM | POA: Insufficient documentation

## 2013-07-08 DIAGNOSIS — N12 Tubulo-interstitial nephritis, not specified as acute or chronic: Secondary | ICD-10-CM | POA: Insufficient documentation

## 2013-07-08 DIAGNOSIS — N949 Unspecified condition associated with female genital organs and menstrual cycle: Secondary | ICD-10-CM | POA: Insufficient documentation

## 2013-07-08 DIAGNOSIS — N898 Other specified noninflammatory disorders of vagina: Secondary | ICD-10-CM | POA: Insufficient documentation

## 2013-07-08 DIAGNOSIS — K5900 Constipation, unspecified: Secondary | ICD-10-CM | POA: Insufficient documentation

## 2013-07-08 DIAGNOSIS — Z3202 Encounter for pregnancy test, result negative: Secondary | ICD-10-CM | POA: Insufficient documentation

## 2013-07-08 DIAGNOSIS — Z79899 Other long term (current) drug therapy: Secondary | ICD-10-CM | POA: Insufficient documentation

## 2013-07-08 DIAGNOSIS — R112 Nausea with vomiting, unspecified: Secondary | ICD-10-CM | POA: Insufficient documentation

## 2013-07-08 LAB — COMPREHENSIVE METABOLIC PANEL
Albumin: 4.1 g/dL (ref 3.5–5.2)
Alkaline Phosphatase: 49 U/L (ref 39–117)
BUN: 7 mg/dL (ref 6–23)
Calcium: 9.5 mg/dL (ref 8.4–10.5)
Potassium: 3.6 mEq/L (ref 3.5–5.1)
Sodium: 136 mEq/L (ref 135–145)
Total Protein: 7.1 g/dL (ref 6.0–8.3)

## 2013-07-08 LAB — URINALYSIS, ROUTINE W REFLEX MICROSCOPIC
Glucose, UA: NEGATIVE mg/dL
Ketones, ur: NEGATIVE mg/dL
Specific Gravity, Urine: 1.027 (ref 1.005–1.030)
pH: 6 (ref 5.0–8.0)

## 2013-07-08 LAB — CBC WITH DIFFERENTIAL/PLATELET
Basophils Relative: 0 % (ref 0–1)
Eosinophils Absolute: 0.1 10*3/uL (ref 0.0–0.7)
MCH: 32.3 pg (ref 26.0–34.0)
MCHC: 34.1 g/dL (ref 30.0–36.0)
Monocytes Relative: 5 % (ref 3–12)
Neutrophils Relative %: 78 % — ABNORMAL HIGH (ref 43–77)
Platelets: 301 10*3/uL (ref 150–400)

## 2013-07-08 LAB — URINE MICROSCOPIC-ADD ON

## 2013-07-08 LAB — WET PREP, GENITAL
Trich, Wet Prep: NONE SEEN
Yeast Wet Prep HPF POC: NONE SEEN

## 2013-07-08 LAB — POCT PREGNANCY, URINE: Preg Test, Ur: NEGATIVE

## 2013-07-08 MED ORDER — ONDANSETRON 4 MG PO TBDP
4.0000 mg | ORAL_TABLET | Freq: Once | ORAL | Status: AC
  Administered 2013-07-08: 4 mg via ORAL
  Filled 2013-07-08: qty 1

## 2013-07-08 MED ORDER — ONDANSETRON HCL 4 MG PO TABS: 4.0000 mg | ORAL_TABLET | Freq: Four times a day (QID) | ORAL | Status: DC

## 2013-07-08 MED ORDER — IBUPROFEN 800 MG PO TABS
800.0000 mg | ORAL_TABLET | Freq: Once | ORAL | Status: AC
  Administered 2013-07-08: 800 mg via ORAL
  Filled 2013-07-08: qty 1

## 2013-07-08 MED ORDER — SULFAMETHOXAZOLE-TRIMETHOPRIM 800-160 MG PO TABS: 1.0000 | ORAL_TABLET | Freq: Two times a day (BID) | ORAL | Status: DC

## 2013-07-08 NOTE — ED Notes (Signed)
She c/o persistent llq area abd. Pain x 3-4 days.  She also c/o the feeling "that I need to have a b.m., but my bowels are moving ok, so it's strange."  She denies any bleeding, or vag. D/c.

## 2013-07-08 NOTE — ED Provider Notes (Signed)
History    CSN: [id number] Arrival date & time 07/08/13  1711  First MD Initiated Contact with Patient 07/08/13 1750     Chief Complaint  Patient presents with  . Abdominal Pain   (Consider location/radiation/quality/duration/timing/severity/associated sxs/prior Treatment) The history is provided by the patient. No language interpreter was used.  Kelly Wang is a 37 y/o F, [id number], with PMHx of asthma presenting to the ED with LLQ and left pelvic pain that has been ongoing for the past 3-4 days, described as a pressure sensation, patient stated that she feels like she is constipated, but she has been having bowel movements. Stated that the discomfort radiates to the lower back. Pain worsens with motion, gets better with laying down. Reported that she has been using Aleve, ASA, and Laxatives with minimal relief. Associated symptoms of nausea and vomiting, stated that she has vomited at least once per day, twice today mainly of clear liquid. Stated that she has been eating normally. Patient concerned because she had unprotected sex approximately 1 week ago. LMP 06/28/2013 - denied vaginal bleeding, vaginal pain, vaginal discharge that is out of the normal. Denied melena, hematochezia, hematuria, dysuria, history of ovarian cysts, history of fibroids, changes to appetite, headache, dizziness, chest pain, shortness of breath, difficulty breathing. PCP none     Past Medical History  Diagnosis Date  . Asthma    Past Surgical History  Procedure Laterality Date  . Breast surgery     Family History  Problem Relation Age of Onset  . Emphysema Maternal Grandfather   . Heart disease Maternal Grandmother   . Diabetes Maternal Grandmother   . Kidney failure Maternal Grandmother    History  Substance Use Topics  . Smoking status: Current Some Day Smoker -- 0.30 packs/day for 6 years    Types: Cigarettes  . Smokeless tobacco: Not on file     Comment: pt states she smokes a signal cig  occasionally a month  . Alcohol Use: Yes     Comment: occasional drinker   OB History   Grav Para Term Preterm Abortions TAB SAB Ect Mult Living                 Review of Systems  Constitutional: Negative for fever and chills.  HENT: Negative for sore throat, trouble swallowing, neck pain and neck stiffness.   Eyes: Negative for visual disturbance.  Respiratory: Negative for chest tightness and shortness of breath.   Cardiovascular: Negative for chest pain.  Gastrointestinal: Positive for nausea, vomiting and abdominal pain (LLQ). Negative for diarrhea.  Genitourinary: Positive for pelvic pain. Negative for decreased urine volume, vaginal bleeding, vaginal discharge, difficulty urinating and vaginal pain.  Musculoskeletal: Positive for back pain (lower back pain).  Neurological: Negative for dizziness, weakness and numbness.  All other systems reviewed and are negative.    Allergies  Review of patient's allergies indicates no known allergies.  Home Medications   Current Outpatient Rx  Name  Route  Sig  Dispense  Refill  . albuterol (PROVENTIL HFA;VENTOLIN HFA) 108 (90 BASE) MCG/ACT inhaler   Inhalation   Inhale 2 puffs into the lungs every 4 (four) hours as needed. For shortness of breath.   1 Inhaler   2   . albuterol (PROVENTIL) (2.5 MG/3ML) 0.083% nebulizer solution   Nebulization   Take 3 mLs (2.5 mg total) by nebulization every 6 (six) hours as needed for wheezing. Dx code 493.90   120 mL   0   . bisacodyl (  LAXATIVE) 5 MG EC tablet   Oral   Take 5 mg by mouth daily as needed for constipation.         . Fluticasone-Salmeterol (ADVAIR) 250-50 MCG/DOSE AEPB   Inhalation   Inhale 1 puff into the lungs every 12 (twelve) hours.   14 each   0   . guaiFENesin (MUCINEX) 600 MG 12 hr tablet   Oral   Take 1,200 mg by mouth 2 (two) times daily as needed. For congestion         . loratadine (CLARITIN) 10 MG tablet   Oral   Take 10 mg by mouth daily as needed. For  allergies         . naproxen sodium (ANAPROX) 220 MG tablet   Oral   Take 440 mg by mouth 2 (two) times daily with a meal.         . ondansetron (ZOFRAN) 4 MG tablet   Oral   Take 1 tablet (4 mg total) by mouth every 6 (six) hours.   12 tablet   0   . sulfamethoxazole-trimethoprim (BACTRIM DS,SEPTRA DS) 800-160 MG per tablet   Oral   Take 1 tablet by mouth 2 (two) times daily. One po bid x 7 days   28 tablet   0    BP 105/70  Pulse 78  Temp(Src) 98.4 F (36.9 C) (Oral)  Resp 18  SpO2 99%  LMP 06/28/2013 Physical Exam  Nursing note and vitals reviewed. Constitutional: She is oriented to person, place, and time. She appears well-developed and well-nourished. No distress.  Patient appears comfortable, calm, and collective.   HENT:  Head: Normocephalic and atraumatic.  Eyes: Conjunctivae and EOM are normal. Pupils are equal, round, and reactive to light. Right eye exhibits no discharge. Left eye exhibits no discharge.  Neck: Normal range of motion. Neck supple.  Cardiovascular: Normal rate, regular rhythm and normal heart sounds.  Exam reveals no friction rub.   No murmur heard. Pulses:      Radial pulses are 2+ on the right side, and 2+ on the left side.       Dorsalis pedis pulses are 2+ on the right side, and 2+ on the left side.  Pulmonary/Chest: Effort normal and breath sounds normal. No respiratory distress. She has no wheezes. She has no rales.  Abdominal: Soft. Normal appearance and bowel sounds are normal. She exhibits no distension. There is no hepatosplenomegaly. There is tenderness. There is no rebound, no guarding and no CVA tenderness.    Genitourinary: Vaginal discharge found.  Speculum: Negative swelling, erythema, inflammation, sores, lesions noted to the external genitalia. Negative blood in vaginal vault. Discharge noted, thin, white non-odorous. Negative swelling, erythema, inflammation, strawberry appearance to the cervix.   Pelvic: negative CMT.  Negative adnexal tenderness, bilaterally.    Lymphadenopathy:    She has no cervical adenopathy.  Neurological: She is alert and oriented to person, place, and time. She exhibits normal muscle tone. Coordination normal.  Skin: Skin is warm and dry. No rash noted. She is not diaphoretic. No erythema.  Psychiatric: She has a normal mood and affect. Her behavior is normal. Thought content normal.    ED Course  Procedures (including critical care time)  Medications  ondansetron (ZOFRAN-ODT) disintegrating tablet 4 mg (4 mg Oral Given 07/08/13 2130)  ibuprofen (ADVIL,MOTRIN) tablet 800 mg (800 mg Oral Given 07/08/13 2130)    Labs Reviewed  WET PREP, GENITAL - Abnormal; Notable for the following:    Clue Cells  Wet Prep HPF POC FEW (*)    WBC, Wet Prep HPF POC MANY (*)    All other components within normal limits  CBC WITH DIFFERENTIAL - Abnormal; Notable for the following:    WBC 12.2 (*)    RBC 3.72 (*)    HCT 35.2 (*)    Neutrophils Relative % 78 (*)    Neutro Abs 9.5 (*)    All other components within normal limits  URINALYSIS, ROUTINE W REFLEX MICROSCOPIC - Abnormal; Notable for the following:    APPearance CLOUDY (*)    Leukocytes, UA LARGE (*)    All other components within normal limits  URINE MICROSCOPIC-ADD ON - Abnormal; Notable for the following:    Squamous Epithelial / LPF FEW (*)    All other components within normal limits  URINE CULTURE  GC/CHLAMYDIA PROBE AMP  COMPREHENSIVE METABOLIC PANEL  POCT PREGNANCY, URINE   No results found. 1. Pyelonephritis     MDM  Patient is a 37 y/o F presenting to the ED LLQ and left sided pelvic pain with associated nausea and vomiting. Negative acute abdomen, negative peritoneal signs. Non-distended, soft, BS normoactive. Mild discomfort upon palpation to the left pelvic region. Negative findings noted to pelvic exam - thin, white non-odorous discharge noted.  Wet prep negative findings. CBC mild elevation of WBC (12.2). CMP  negative findings. Urine pregnancy negative. UA large amount of leukocytes, WBC too numerous to count. US abdomen and pelvis not recommended by Dr. Cheri Rous - were discontinued by Dr. Cheri Rous. Patient stable, afebrile. Patient comfortable. Non-septic. Suspicion to be pyelonephritis. Discharged patient with antibiotics and anti-emetics. [facility],. Discussed with patient the importance of Pap smears every year since sexually active. Discussed with patient to rest and stay hydrated. Discussed with patient to continue to monitor symptoms and if symptoms are to worsen or change to report back to the ED - strict return instructions given.  Patient agreed to plan of care, understood, all questions answered.   Raymon Mutton, PA-C 07/08/13 2146

## 2013-07-08 NOTE — ED Provider Notes (Signed)
Medical screening examination/treatment/procedure(s) were performed by non-physician practitioner and as supervising physician I was immediately available for consultation/collaboration.    Richardean Canal, MD 07/08/13 2308

## 2013-07-10 LAB — URINE CULTURE: Colony Count: 40000

## 2013-07-11 NOTE — ED Notes (Signed)
Chart sent to EDP office for review.+ gonorrhea

## 2013-07-16 ENCOUNTER — Encounter: Payer: Self-pay | Admitting: Internal Medicine

## 2013-07-16 ENCOUNTER — Ambulatory Visit (INDEPENDENT_AMBULATORY_CARE_PROVIDER_SITE_OTHER): Payer: Self-pay | Admitting: Internal Medicine

## 2013-07-16 VITALS — BP 118/72 | HR 95 | Temp 97.6°F | Ht 64.0 in | Wt 162.0 lb

## 2013-07-16 DIAGNOSIS — F17200 Nicotine dependence, unspecified, uncomplicated: Secondary | ICD-10-CM

## 2013-07-16 DIAGNOSIS — J301 Allergic rhinitis due to pollen: Secondary | ICD-10-CM

## 2013-07-16 DIAGNOSIS — Z9109 Other allergy status, other than to drugs and biological substances: Secondary | ICD-10-CM

## 2013-07-16 DIAGNOSIS — J45909 Unspecified asthma, uncomplicated: Secondary | ICD-10-CM

## 2013-07-16 DIAGNOSIS — J4540 Moderate persistent asthma, uncomplicated: Secondary | ICD-10-CM

## 2013-07-16 NOTE — Patient Instructions (Addendum)
#  Moderate ASthma  - because advair expensive, use QVAR 2 puff twice daily scheduled without fail.   -  use albuterol as needed  - [facility] will [facility]  - at followup do spirometry and we can discuss genetic testing and allergy testing but now focus is to get you to feel better - flu shot in fall  #Smoking  - -please work on quitting completely; we can talk more at followup. Quitting will improve your asthma  #Allergies  - take  OTC nasacort  2 squirts each nostril daily  #Followup   3 months with spirometry  call us [phone] if not well  compliance with Rx important

## 2013-07-16 NOTE — Progress Notes (Signed)
Subjective:    Patient ID: Kelly Wang, female    DOB: [birth year], 37 y.o.   MRN: [id number]  HPI IOV 04/19/2012   37 year old female.  Body mass index is 30.83 kg/(m^2). Uninsured.  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 Reports that she has been smoking Cigarettes.  She has a 1.8 pack-year smoking history. Started smoking at age 32s. Quit at age 35 but restarted agge 59. [place] smoking 1 pack per week. Now smoking 2 cig per day.  Grandfather died of emphysema. Had "bad" allergies a as child.    Went to ER and referred here for "asthma"  Says she has "adult onset asthma". Insidious onset 2 years ago. AT onset recollects going to ER for dyspnea at time of diagnosis.   No associated pmd and therefore says controller medications lacking. Symptoms of asthma are cough, chest tightness, dyspnea, white mucus, wheezing and raspy voice. Associated pollen induced allergies +. Symptoms are episodic. Symptoms worsened by weather change esp cold or hot, stress, cigarettes, emotions, pollen Sometimes hair spray can make things worse but not usually. Symptoms improved by albuterol. 2 years ago took advair and it helped. Uses albuterol rescue at baseline 2-3 times per day. Significant nocturnal awakenings present due to symptoms - waking up daily past several months.  Currently with advair sample  that helped but ran out of it few days ago and feels symptoms worsening past 2 days.  Prednisone always helps. She has numerous ER visits, and prednisone burst (says 25-50 times past 2 years). 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 Used to have [place] pigs but now one dog (recollects asthma dx at time of getting [place] pig). No cats. No birds. No roaches.    Spirometry today - fev1 1.96L/75%, Ratio 61 - c/w mild- moderate obstruction  cxr 02/24/12- hyperinflated   #Moderate ASthma  - please use advair 250/50 1 puff twice daily; several samples given.  - do Not forget your advair  - use albuterol as needed  -  nurse will see if [facility]  - at followup do spirometry and we can discuss genetic testing and allergy testing but now focus is to get you to feel better  #Smoking  - -please work on quitting completely; we can talk more at followup. Quitting will improve your asthma  #Allergies  - take sample nasal steroid and prescription take generic fluticasone inhaler 2 squirts each nostril daily  #Followup  6 weeks with spirometry  call us [phone] if not well  compliance with Rx important   OV 07/16/2013   FU Mod[place]ae persistnet asthma. Seen 1 year ago but then never followwed up because of "ife issues" such as work, and kids. Still uninsured. Not medicaid eligibile. HAs not bothered to look in [facility] [employer] care. Still smoking. Ran out of advair several months ago. After than albuterol rescue use jumped from one per week to 4 per week. Still with occassional chest tightness, runny nose.   Past, Family, Social reviewed: no change since last visit   reports that she has been smoking Cigarettes.  She has a 1.8 pack-year smoking history. She does not have any smokeless tobacco history on file.   Review of Systems  Constitutional: Negative for fever and unexpected weight change.  HENT: Negative for ear pain, nosebleeds, congestion, sore throat, rhinorrhea, sneezing, trouble swallowing, dental problem, postnasal drip and sinus pressure.   Eyes: Negative for redness and itching.  Respiratory: Negative for cough,  chest tightness, shortness of breath and wheezing.   Cardiovascular: Negative for palpitations and leg swelling.  Gastrointestinal: Negative for nausea and vomiting.  Genitourinary: Negative for dysuria.  Musculoskeletal: Negative for joint swelling.  Skin: Negative for rash.  Neurological: Negative for headaches.  Hematological: Does not bruise/bleed easily.  Psychiatric/Behavioral: Negative for dysphoric mood. The patient is not nervous/anxious.        Objective:    Physical Exam Vitals reviewed. Constitutional: She is oriented to person, place, and time. She appears well-developed and well-nourished. No distress.  Body mass index is 27.79 kg/(m^2).    HENT:  Head: Normocephalic and atraumatic.  Right Ear: External ear normal.  Left Ear: External ear normal.  Mouth/Throat: Oropharynx is clear and moist. No oropharyngeal exudate.  Eyes: Conjunctivae and EOM are normal. Pupils are equal, round, and reactive to light. Right eye exhibits no discharge. Left eye exhibits no discharge. No scleral icterus.  Neck: Normal range of motion. Neck supple. No JVD present. No tracheal deviation present. No thyromegaly present.  Cardiovascular: Normal rate, regular rhythm, normal heart sounds and intact distal pulses.  Exam reveals no gallop and no friction rub.   No murmur heard. Pulmonary/Chest: Effort normal and breath sounds normal. No respiratory distress. She has no wheezes. She has no rales. She exhibits no tenderness.  Abdominal: Soft. Bowel sounds are normal. She exhibits no distension and no mass. There is no tenderness. There is no rebound and no guarding.  Musculoskeletal: Normal range of motion. She exhibits no edema and no tenderness.  Lymphadenopathy:    She has no cervical adenopathy.  Neurological: She is alert and oriented to person, place, and time. She has normal reflexes. No cranial nerve deficit. She exhibits normal muscle tone. Coordination normal.  Skin: Skin is warm and dry. No rash noted. She is not diaphoretic. No erythema. No pallor.  Psychiatric: She has a normal mood and affect. Her behavior is normal. Judgment and thought content normal.          Assessment & Plan:

## 2013-07-20 ENCOUNTER — Inpatient Hospital Stay: Payer: Self-pay

## 2013-07-26 NOTE — Assessment & Plan Note (Signed)
#  Smoking  - -please work on quitting completely; we can talk more at followup. Quitting will improve your asthma

## 2013-07-26 NOTE — Assessment & Plan Note (Signed)
#  Allergies  - take  OTC nasacort  2 squirts each nostril daily

## 2013-07-26 NOTE — Assessment & Plan Note (Signed)
#  Moderate ASthma  - because advair expensive, use QVAR 2 puff twice daily scheduled without fail.   -  use albuterol as needed  - [facility] will see if [facility]  - at followup do spirometry and we can discuss genetic testing and allergy testing but now focus is to get you to feel better - flu shot in fall  #Followup   3 months with spirometry  call us [phone] if not well  compliance with Rx important

## 2013-09-28 ENCOUNTER — Other Ambulatory Visit: Payer: Self-pay | Admitting: Internal Medicine

## 2013-10-10 ENCOUNTER — Ambulatory Visit (INDEPENDENT_AMBULATORY_CARE_PROVIDER_SITE_OTHER): Payer: Self-pay | Admitting: Internal Medicine

## 2013-10-10 ENCOUNTER — Encounter: Payer: Self-pay | Admitting: Internal Medicine

## 2013-10-10 VITALS — BP 112/68 | HR 80 | Temp 99.8°F | Ht 64.0 in | Wt 162.4 lb

## 2013-10-10 DIAGNOSIS — Z23 Encounter for immunization: Secondary | ICD-10-CM

## 2013-10-10 DIAGNOSIS — J45909 Unspecified asthma, uncomplicated: Secondary | ICD-10-CM

## 2013-10-10 DIAGNOSIS — J4540 Moderate persistent asthma, uncomplicated: Secondary | ICD-10-CM

## 2013-10-10 NOTE — Progress Notes (Signed)
Subjective:    Patient ID: Kelly Wang, female    DOB: 1976-01-10, 37 y.o.   MRN: [id number]  HPI HPI IOV 04/19/2012   37 year old female.  Body mass index is 30.83 kg/(m^2). Uninsured.  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 Reports that she has been smoking Cigarettes.  She has a 1.8 pack-year smoking history. Started smoking at age 59s. Quit at age 69 but restarted agge 48. [place] smoking 1 pack per week. Now smoking 2 cig per day.  Grandfather died of emphysema. Had "bad" allergies a as child.    Went to ER and referred here for "asthma"  Says she has "adult onset asthma". Insidious onset 2 years ago. AT onset recollects going to ER for dyspnea at time of diagnosis.   No associated pmd and therefore says controller medications lacking. Symptoms of asthma are cough, chest tightness, dyspnea, white mucus, wheezing and raspy voice. Associated pollen induced allergies +. Symptoms are episodic. Symptoms worsened by weather change esp cold or hot, stress, cigarettes, emotions, pollen Sometimes hair spray can make things worse but not usually. Symptoms improved by albuterol. 2 years ago took advair and it helped. Uses albuterol rescue at baseline 2-3 times per day. Significant nocturnal awakenings present due to symptoms - waking up daily past several months.  Currently with advair sample  that helped but ran out of it few days ago and feels symptoms worsening past 2 days.  Prednisone always helps. She has numerous ER visits, and prednisone burst (says 25-50 times past 2 years). 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 Used to have [place] pigs but now one dog (recollects asthma dx at time of getting [place] pig). No cats. No birds. No roaches.    Spirometry today - fev1 1.96L/75%, Ratio 61 - c/w mild- moderate obstruction  cxr 02/24/12- hyperinflated   #Moderate ASthma  - please use advair 250/50 1 puff twice daily; several samples given.  - do Not forget your advair  - use albuterol as needed   - nurse will see if [facility] can [facility]  - at followup do spirometry and we can discuss genetic testing and allergy testing but now focus is to get you to feel better  #Smoking  - -please work on quitting completely; we can talk more at followup. Quitting will improve your asthma  #Allergies  - take sample nasal steroid and prescription take generic fluticasone inhaler 2 squirts each nostril daily  #Followup  6 weeks with spirometry  call us [phone] if not well  compliance with Rx important   OV 07/16/13   FU Modertae persistnet asthma. Seen 1 year ago but then never followwed up because of "ife issues" such as work, and kids. Still uninsured. Not medicaid eligibile. HAs not bothered to look in [facility] Obama care. Still smoking. Ran out of advair several months ago. After than albuterol rescue use jumped from one per week to 4 per week. Still with occassional chest tightness, runny nose.   Past, Family, Social reviewed: no change since last visit   OV 10/10/2013  Followup moderate persistent asthma. Last seen July 2014. Despite extensive counseling she is not taking her Qvar. Neurologic as in am not feeling of sick why should I take it. She is afraid of the flu in the fall season in the winter season. She is taking albuterol at least once a day. Has occasional chest tightness but otherwise feels well. After extensive counseling she is agreed to a  Qvar on a daily basis. She will have a flu shot today  Review of Systems  Constitutional: Negative for fever and unexpected weight change.  HENT: Positive for congestion. Negative for dental problem, ear pain, nosebleeds, postnasal drip, rhinorrhea, sinus pressure, sneezing, sore throat and trouble swallowing.   Eyes: Negative for redness and itching.  Respiratory: Positive for cough, chest tightness, shortness of breath and wheezing.   Cardiovascular: Negative for palpitations and leg swelling.  Gastrointestinal: Negative for  nausea and vomiting.  Genitourinary: Negative for dysuria.  Musculoskeletal: Negative for joint swelling.  Skin: Negative for rash.  Neurological: Negative for headaches.  Hematological: Does not bruise/bleed easily.  Psychiatric/Behavioral: Negative for dysphoric mood. The patient is not nervous/anxious.        Objective:   Physical Exam  Vitals reviewed. Constitutional: She is oriented to person, place, and time. She appears well-developed and well-nourished. No distress.  HENT:  Head: Normocephalic and atraumatic.  Right Ear: External ear normal.  Left Ear: External ear normal.  Mouth/Throat: Oropharynx is clear and moist. No oropharyngeal exudate.  Eyes: Conjunctivae and EOM are normal. Pupils are equal, round, and reactive to light. Right eye exhibits no discharge. Left eye exhibits no discharge. No scleral icterus.  Neck: Normal range of motion. Neck supple. No JVD present. No tracheal deviation present. No thyromegaly present.  Cardiovascular: Normal rate, regular rhythm, normal heart sounds and intact distal pulses.  Exam reveals no gallop and no friction rub.   No murmur heard. Pulmonary/Chest: Effort normal and breath sounds normal. No respiratory distress. She has no wheezes. She has no rales. She exhibits no tenderness.  Abdominal: Soft. Bowel sounds are normal. She exhibits no distension and no mass. There is no tenderness. There is no rebound and no guarding.  Musculoskeletal: Normal range of motion. She exhibits no edema and no tenderness.  Lymphadenopathy:    She has no cervical adenopathy.  Neurological: She is alert and oriented to person, place, and time. She has normal reflexes. No cranial nerve deficit. She exhibits normal muscle tone. Coordination normal.  Skin: Skin is warm and dry. No rash noted. She is not diaphoretic. No erythema. No pallor.  Psychiatric: She has a normal mood and affect. Her behavior is normal. Judgment and thought content normal.           Assessment & Plan:

## 2013-10-10 NOTE — Patient Instructions (Addendum)
#  Moderate ASthma  - use QVAR 80mcg 2 puff twice daily scheduled without fail.   -  use albuterol as needed  -  flu shot 10/10/2013   #Smoking  - -please work on quitting completely; we can talk more at followup. Quitting will improve your asthma  #AFollowup 6 months or sooner if needed 

## 2013-10-11 NOTE — Assessment & Plan Note (Signed)
#  Moderate ASthma  - use QVAR 2 puff twice daily scheduled without fail.   -  use albuterol as needed  -  flu shot 10/10/2013   #Smoking  - -please work on quitting completely; we can talk more at followup. Quitting will improve your asthma  #AFollowup 6 months or sooner if needed

## 2013-12-26 ENCOUNTER — Other Ambulatory Visit: Payer: Self-pay | Admitting: Internal Medicine

## 2013-12-26 ENCOUNTER — Telehealth: Payer: Self-pay | Admitting: *Deleted

## 2013-12-26 NOTE — Telephone Encounter (Signed)
Spoke with pt-- c/o asthma attack--pt is anxious d/t the pharmacy not having her medication in stock. Went to pharmacy to pick up Albuterol Rx that was sent in this morning and they do not have the brand in stock that we called in. Spoke with pharmacist and gave ok to switch to Proventil. Pt advised to go pick up inhaler and use as soon as she gets it. Instructed to do pursed lip breathing to calm down and use the hfa as directed. Pt aware that our office is closed tomorrow but will be open Friday 12/28/13 if she is still having problems. Pt advised to seek help at ED if symptoms worsen through tonight. Pt states that she feels she will be okay once she gets her Rx.  Will forward to MR and Nurse as Lorain Childes.

## 2013-12-31 ENCOUNTER — Emergency Department (HOSPITAL_COMMUNITY)
Admission: EM | Admit: 2013-12-31 | Discharge: 2014-01-01 | Disposition: A | Discharge status: Home / Self Care | Payer: Self-pay | Attending: Emergency Medicine | Admitting: Emergency Medicine

## 2013-12-31 ENCOUNTER — Emergency Department (HOSPITAL_COMMUNITY): Payer: Self-pay

## 2013-12-31 ENCOUNTER — Encounter (HOSPITAL_COMMUNITY): Payer: Self-pay | Admitting: Emergency Medicine

## 2013-12-31 DIAGNOSIS — F17200 Nicotine dependence, unspecified, uncomplicated: Secondary | ICD-10-CM | POA: Insufficient documentation

## 2013-12-31 DIAGNOSIS — R05 Cough: Secondary | ICD-10-CM

## 2013-12-31 DIAGNOSIS — R0602 Shortness of breath: Secondary | ICD-10-CM

## 2013-12-31 DIAGNOSIS — R0789 Other chest pain: Secondary | ICD-10-CM | POA: Insufficient documentation

## 2013-12-31 DIAGNOSIS — IMO0002 Reserved for concepts with insufficient information to code with codable children: Secondary | ICD-10-CM | POA: Insufficient documentation

## 2013-12-31 DIAGNOSIS — R42 Dizziness and giddiness: Secondary | ICD-10-CM | POA: Insufficient documentation

## 2013-12-31 DIAGNOSIS — J45901 Unspecified asthma with (acute) exacerbation: Secondary | ICD-10-CM | POA: Insufficient documentation

## 2013-12-31 DIAGNOSIS — Z79899 Other long term (current) drug therapy: Secondary | ICD-10-CM | POA: Insufficient documentation

## 2013-12-31 DIAGNOSIS — R059 Cough, unspecified: Secondary | ICD-10-CM

## 2013-12-31 DIAGNOSIS — R599 Enlarged lymph nodes, unspecified: Secondary | ICD-10-CM | POA: Insufficient documentation

## 2013-12-31 MED ORDER — IPRATROPIUM BROMIDE 0.02 % IN SOLN
0.5000 mg | Freq: Once | RESPIRATORY_TRACT | Status: AC
  Administered 2013-12-31: 0.5 mg via RESPIRATORY_TRACT
  Filled 2013-12-31: qty 2.5

## 2013-12-31 MED ORDER — ALBUTEROL SULFATE (2.5 MG/3ML) 0.083% IN NEBU
5.0000 mg | INHALATION_SOLUTION | Freq: Once | RESPIRATORY_TRACT | Status: AC
  Administered 2013-12-31: 5 mg via RESPIRATORY_TRACT
  Filled 2013-12-31: qty 6

## 2013-12-31 MED ORDER — PREDNISONE 20 MG PO TABS
60.0000 mg | ORAL_TABLET | Freq: Once | ORAL | Status: AC
  Administered 2013-12-31: 60 mg via ORAL
  Filled 2013-12-31: qty 3

## 2013-12-31 MED ORDER — ALBUTEROL SULFATE HFA 108 (90 BASE) MCG/ACT IN AERS
2.0000 | INHALATION_SPRAY | Freq: Once | RESPIRATORY_TRACT | Status: AC
  Administered 2014-01-01: 2 via RESPIRATORY_TRACT
  Filled 2013-12-31: qty 6.7

## 2013-12-31 NOTE — ED Notes (Signed)
Pt states she has had a cold and has been taking OTC medication for it but over the past day it has gotten worse  Pt states she has used her inhaler and medications but nothing has helped  Pt states she is having a hard time breathing

## 2013-12-31 NOTE — ED Provider Notes (Signed)
CSN: [id number]     Arrival date & time 12/31/13  2058 History   This chart was scribed for non-physician practitioner Antony Madura, PA-C, working with No att. providers found by Nicholos Johns, ED scribe. This patient was seen in room WTR9/WTR9 and the patient's care was started at 10:51 PM.    Chief Complaint  Patient presents with  . Asthma   Patient is a 38 y.o. female presenting with asthma. The history is provided by the patient. No language interpreter was used.  Asthma This is a chronic problem. The problem has been gradually worsening. Associated symptoms include shortness of breath. The symptoms are aggravated by exertion. The symptoms are relieved by relaxation, rest and medications.   HPI Comments: Kelly Wang is a 38 y.o. female w/hx of asthma presents to the Emergency Department complaining of gradually worsening SOB, chest tightness, wheezing, nasal congestion, lightheadedness, dizziness, onset 3-4 days. Pt denies vomiting but states she has been gagging. Pt has been using her inhaler and has since run out. Pt has been using liquid albuterol with her breathing machine at home with little relief. Pt believes she needs another breathing treatment. Denies LOC, vomiting, fever.   Past Medical History  Diagnosis Date  . Asthma    Past Surgical History  Procedure Laterality Date  . Breast surgery     Family History  Problem Relation Age of Onset  . Emphysema Maternal Grandfather   . Heart disease Maternal Grandmother   . Diabetes Maternal Grandmother   . Kidney failure Maternal Grandmother    History  Substance Use Topics  . Smoking status: Current Some Day Smoker -- 0.30 packs/day for 6 years    Types: Cigarettes  . Smokeless tobacco: Not on file     Comment: pt states she smokes a signal cig occasionally a month  . Alcohol Use: Yes     Comment: occasional drinker   OB History   Grav Para Term Preterm Abortions TAB SAB Ect Mult Living                 Review  of Systems  Constitutional: Negative for fever.  HENT: Positive for congestion.   Respiratory: Positive for cough, chest tightness, shortness of breath and wheezing.   Gastrointestinal: Negative for vomiting.  Neurological: Positive for dizziness and light-headedness. Negative for syncope.  All other systems reviewed and are negative.    Allergies  Review of patient's allergies indicates no known allergies.  Home Medications   Current Outpatient Rx  Name  Route  Sig  Dispense  Refill  . acetaminophen (TYLENOL) 500 MG tablet   Oral   Take 500 mg by mouth every 6 (six) hours as needed for moderate pain.         Marland Kitchen albuterol (PROVENTIL HFA;VENTOLIN HFA) 108 (90 BASE) MCG/ACT inhaler   Inhalation   Inhale 2 puffs into the lungs every 4 (four) hours as needed for wheezing or shortness of breath.         Marland Kitchen albuterol (PROVENTIL) (2.5 MG/3ML) 0.083% nebulizer solution   Nebulization   Take 2.5 mg by nebulization every 6 (six) hours as needed for wheezing or shortness of breath.         . guaiFENesin (MUCINEX) 600 MG 12 hr tablet   Oral   Take 1,200 mg by mouth 2 (two) times daily.         . Pseudoeph-Doxylamine-DM-APAP (NYQUIL PO)   Oral   Take 2 tablets by mouth at bedtime as  needed (cold and flu symptoms.).         Marland Kitchen HYDROcodone-homatropine (HYCODAN) 5-1.5 MG/5ML syrup   Oral   Take 5 mLs by mouth every 6 (six) hours as needed for cough.   120 mL   0   . predniSONE (DELTASONE) 20 MG tablet   Oral   Take 2 tablets (40 mg total) by mouth daily.   10 tablet   0    Triage Vitals: BP 116/78  Pulse 107  Temp(Src) 98.1 F (36.7 C) (Oral)  Resp 20  Ht 5\' 4"  (1.626 m)  Wt 160 lb (72.576 kg)  BMI 27.45 kg/m2  SpO2 100%  LMP 12/06/2013 Physical Exam  Nursing note and vitals reviewed. Constitutional: She is oriented to person, place, and time. She appears well-developed and well-nourished. No distress.  HENT:  Head: Normocephalic and atraumatic.  Mouth/Throat:  Oropharynx is clear and moist. No oropharyngeal exudate.  Uvula midline, no erythema  Eyes: Conjunctivae and EOM are normal. Pupils are equal, round, and reactive to light. No scleral icterus.  Neck: Normal range of motion.  Cardiovascular: Normal rate, regular rhythm and normal heart sounds.   Pulmonary/Chest: Effort normal. No respiratory distress. She has wheezes (diffuse expiratory). She has no rales. She exhibits no tenderness.  No retractions or accessory muscle use  Musculoskeletal: Normal range of motion.  Lymphadenopathy:    She has cervical adenopathy (mild anterior).  Neurological: She is alert and oriented to person, place, and time.  Skin: Skin is warm and dry. No rash noted. She is not diaphoretic. No erythema. No pallor.  Psychiatric: She has a normal mood and affect. Her behavior is normal.    ED Course  Procedures (including critical care time) DIAGNOSTIC STUDIES: Oxygen Saturation is 100% on 02, normal by my interpretation.    COORDINATION OF CARE: At 10:51 PM: Discussed treatment plan with patient which includes breathing tx and a CXR. Patient agrees.   Labs Review Labs Reviewed - No data to display Imaging Review Dg Chest 2 View  12/31/2013   CLINICAL DATA:  Shortness of breath  EXAM: CHEST  2 VIEW  COMPARISON:  02/23/2013  FINDINGS: The heart size and mediastinal contours are within normal limits. Both lungs are clear. The visualized skeletal structures are unremarkable.  IMPRESSION: No active cardiopulmonary disease.   Electronically Signed   By: Tiburcio Pea M.D.   On: 12/31/2013 23:58     EKG Interpretation   None       MDM   1. Cough   2. Shortness of breath    Uncomplicated shortness of breath. Patient well and nontoxic appearing, hemodynamically stable, and afebrile. Lungs with diffuse expiratory wheezes, improved after DuoNeb x2 and oral steroids. Patient without retractions or accessory muscle use. She ambulates in ED without hypoxia. X-ray  negative for focal consolidation or pneumonia. No pneumothorax or pleural effusion. She is stable for discharge with prescription for 5 day course of steroids as well as albuterol inhaler. Hycodan prescribed for persistent cough. Return precautions discussed and patient agreeable to plan with no unaddressed concerns  I personally performed the services described in this documentation, which was scribed in my presence. The recorded information has been reviewed and is accurate.   Filed Vitals:   12/31/13 2102 12/31/13 2331  BP: 116/78   Pulse: 107   Temp: 98.1 F (36.7 C)   TempSrc: Oral   Resp: 20   Height: 5\' 4"  (1.626 m)   Weight: 160 lb (72.576 kg)   SpO2:  100% 99%       Antony MaduraKelly Shilpa Bushee, [place] JerseyPA-C 01/10/14 [phone]

## 2014-01-01 MED ORDER — PREDNISONE 20 MG PO TABS: 40.0000 mg | ORAL_TABLET | Freq: Every day | ORAL | Status: DC

## 2014-01-01 MED ORDER — HYDROCODONE-HOMATROPINE 5-1.5 MG/5ML PO SYRP: 5.0000 mL | ORAL_SOLUTION | Freq: Four times a day (QID) | ORAL | Status: DC | PRN

## 2014-01-01 NOTE — Discharge Instructions (Signed)

## 2014-01-12 NOTE — ED Provider Notes (Signed)
Medical screening examination/treatment/procedure(s) were performed by non-physician practitioner and as supervising physician I was immediately available for consultation/collaboration.  EKG Interpretation   None         Beaux Wedemeyer, MD 01/12/14 1104 

## 2014-04-12 ENCOUNTER — Telehealth: Payer: Self-pay | Admitting: Internal Medicine

## 2014-04-12 NOTE — Telephone Encounter (Signed)
Called spoke with pt. Advised her we have no samples of any rescue inhaler at this time. She did not want RX sent. Nothing further needed

## 2014-10-25 ENCOUNTER — Other Ambulatory Visit: Payer: Self-pay | Admitting: Internal Medicine

## 2014-11-09 ENCOUNTER — Encounter (HOSPITAL_COMMUNITY): Payer: Self-pay | Admitting: Emergency Medicine

## 2014-11-09 ENCOUNTER — Emergency Department (HOSPITAL_COMMUNITY)
Admission: EM | Admit: 2014-11-09 | Discharge: 2014-11-09 | Disposition: A | Discharge status: Home / Self Care | Payer: Self-pay | Attending: Emergency Medicine | Admitting: Emergency Medicine

## 2014-11-09 ENCOUNTER — Emergency Department (HOSPITAL_COMMUNITY): Payer: Self-pay

## 2014-11-09 DIAGNOSIS — Z79899 Other long term (current) drug therapy: Secondary | ICD-10-CM | POA: Insufficient documentation

## 2014-11-09 DIAGNOSIS — J4530 Mild persistent asthma, uncomplicated: Secondary | ICD-10-CM

## 2014-11-09 DIAGNOSIS — Z7952 Long term (current) use of systemic steroids: Secondary | ICD-10-CM | POA: Insufficient documentation

## 2014-11-09 DIAGNOSIS — J4531 Mild persistent asthma with (acute) exacerbation: Secondary | ICD-10-CM | POA: Insufficient documentation

## 2014-11-09 DIAGNOSIS — Z720 Tobacco use: Secondary | ICD-10-CM | POA: Insufficient documentation

## 2014-11-09 MED ORDER — ALBUTEROL (5 MG/ML) CONTINUOUS INHALATION SOLN
10.0000 mg/h | INHALATION_SOLUTION | Freq: Once | RESPIRATORY_TRACT | Status: AC
  Administered 2014-11-09: 10 mg/h via RESPIRATORY_TRACT
  Filled 2014-11-09: qty 20

## 2014-11-09 MED ORDER — PREDNISONE 20 MG PO TABS
60.0000 mg | ORAL_TABLET | Freq: Once | ORAL | Status: AC
  Administered 2014-11-09: 60 mg via ORAL
  Filled 2014-11-09: qty 3

## 2014-11-09 MED ORDER — IPRATROPIUM-ALBUTEROL 0.5-2.5 (3) MG/3ML IN SOLN
3.0000 mL | Freq: Once | RESPIRATORY_TRACT | Status: AC
  Administered 2014-11-09: 3 mL via RESPIRATORY_TRACT
  Filled 2014-11-09: qty 3

## 2014-11-09 MED ORDER — PREDNISONE 10 MG PO TABS: 20.0000 mg | ORAL_TABLET | Freq: Every day | ORAL | Status: DC

## 2014-11-09 NOTE — ED Provider Notes (Signed)
CSN: [id number]     Arrival date & time 11/09/14  1102 History   First MD Initiated Contact with Patient 11/09/14 1109     Chief Complaint  Patient presents with  . Asthma     (Consider location/radiation/quality/duration/timing/severity/associated sxs/prior Treatment) HPI Comments: Patient here complaining of worsening asthma which began last night. Patient states since before with a change in cold weather. She has had a nonproductive cough without fever or chills. No vomiting or diarrhea. She has used her home nebulizer as well as albuterol inhaler without relief. Denies any anginal type chest pain. Denies lower extremity edema. No pleuritic chest pain. Symptoms are worse when going outside and temporarily relieved with albuterol.  Patient is a 38 y.o. female presenting with asthma. The history is provided by the patient.  Asthma    Past Medical History  Diagnosis Date  . Asthma    Past Surgical History  Procedure Laterality Date  . Breast surgery     Family History  Problem Relation Age of Onset  . Emphysema Maternal Grandfather   . Heart disease Maternal Grandmother   . Diabetes Maternal Grandmother   . Kidney failure Maternal Grandmother    History  Substance Use Topics  . Smoking status: Current Some Day Smoker -- 0.30 packs/day for 6 years    Types: Cigarettes  . Smokeless tobacco: Not on file     Comment: pt states she smokes a signal cig occasionally a month  . Alcohol Use: Yes     Comment: occasional drinker   OB History    No data available     Review of Systems  All other systems reviewed and are negative.     Allergies  Review of patient's allergies indicates no known allergies.  Home Medications   Prior to Admission medications   Medication Sig Start Date End Date Taking? Authorizing Provider  acetaminophen (TYLENOL) 500 MG tablet Take 500 mg by mouth every 6 (six) hours as needed for moderate pain.   Yes Historical Provider, MD  albuterol  (PROVENTIL HFA;VENTOLIN HFA) 108 (90 BASE) MCG/ACT inhaler Inhale 2 puffs into the lungs every 4 (four) hours as needed for wheezing or shortness of breath.   Yes Historical Provider, MD  albuterol (PROVENTIL) (2.5 MG/3ML) 0.083% nebulizer solution USE ONE VIAL IN NEBULIZER EVERY 6 HOURS AS NEEDED FOR WHEEZING 10/25/14  Yes Kalman ShanMurali Ramaswamy, MD  guaiFENesin (MUCINEX) 600 MG 12 hr tablet Take 1,200 mg by mouth 2 (two) times daily.   Yes Historical Provider, MD  HYDROcodone-homatropine (HYCODAN) 5-1.5 MG/5ML syrup Take 5 mLs by mouth every 6 (six) hours as needed for cough. 01/01/14   Antony MaduraKelly Humes, PA-C  predniSONE (DELTASONE) 20 MG tablet Take 2 tablets (40 mg total) by mouth daily. 01/01/14   Antony MaduraKelly Humes, PA-C   BP 112/69 mmHg  Pulse 90  Temp(Src) 98.3 F (36.8 C) (Oral)  Resp 24  SpO2 98% Physical Exam  Constitutional: She is oriented to person, place, and time. She appears well-developed and well-nourished.  Non-toxic appearance. No distress.  HENT:  Head: Normocephalic and atraumatic.  Eyes: Conjunctivae, EOM and lids are normal. Pupils are equal, round, and reactive to light.  Neck: Normal range of motion. Neck supple. No tracheal deviation present. No thyroid mass present.  Cardiovascular: Normal rate, regular rhythm and normal heart sounds.  Exam reveals no gallop.   No murmur heard. Pulmonary/Chest: Effort normal. No stridor. No respiratory distress. She has decreased breath sounds. She has wheezes. She has no rhonchi. She  has no rales.  Abdominal: Soft. Normal appearance and bowel sounds are normal. She exhibits no distension. There is no tenderness. There is no rebound and no CVA tenderness.  Musculoskeletal: Normal range of motion. She exhibits no edema or tenderness.  Neurological: She is alert and oriented to person, place, and time. She has normal strength. No cranial nerve deficit or sensory deficit. GCS eye subscore is 4. GCS verbal subscore is 5. GCS motor subscore is 6.  Skin:  Skin is warm and dry. No abrasion and no rash noted.  Psychiatric: She has a normal mood and affect. Her speech is normal and behavior is normal.  Nursing note and vitals reviewed.   ED Course  Procedures (including critical care time) Labs Review Labs Reviewed - No data to display  Imaging Review No results found.   EKG Interpretation None      MDM   Final diagnoses:  None    Patient given prednisone and albuterol here feels better. She exhibits endplate the hallways without difficulty and is stable for discharge    Toy BakerAnthony T Kahlil Cowans, MD 11/09/14 1439

## 2014-11-09 NOTE — Discharge Instructions (Signed)

## 2014-11-09 NOTE — ED Notes (Signed)
Pt states she has been having cough, nasal congestion x 2 days.  This morning, began having an asthma attack. Winded when trying to talk.

## 2014-11-11 ENCOUNTER — Emergency Department (HOSPITAL_COMMUNITY)
Admission: EM | Admit: 2014-11-11 | Discharge: 2014-11-11 | Disposition: A | Discharge status: Home / Self Care | Payer: Self-pay | Attending: Emergency Medicine | Admitting: Emergency Medicine

## 2014-11-11 ENCOUNTER — Emergency Department (HOSPITAL_COMMUNITY): Payer: Self-pay

## 2014-11-11 ENCOUNTER — Encounter (HOSPITAL_COMMUNITY): Payer: Self-pay | Admitting: Emergency Medicine

## 2014-11-11 DIAGNOSIS — Z720 Tobacco use: Secondary | ICD-10-CM | POA: Insufficient documentation

## 2014-11-11 DIAGNOSIS — R05 Cough: Secondary | ICD-10-CM

## 2014-11-11 DIAGNOSIS — Z7952 Long term (current) use of systemic steroids: Secondary | ICD-10-CM | POA: Insufficient documentation

## 2014-11-11 DIAGNOSIS — J4541 Moderate persistent asthma with (acute) exacerbation: Secondary | ICD-10-CM | POA: Insufficient documentation

## 2014-11-11 DIAGNOSIS — R0602 Shortness of breath: Secondary | ICD-10-CM

## 2014-11-11 DIAGNOSIS — Z79899 Other long term (current) drug therapy: Secondary | ICD-10-CM | POA: Insufficient documentation

## 2014-11-11 DIAGNOSIS — R062 Wheezing: Secondary | ICD-10-CM

## 2014-11-11 DIAGNOSIS — R059 Cough, unspecified: Secondary | ICD-10-CM

## 2014-11-11 LAB — BASIC METABOLIC PANEL
ANION GAP: UNDETERMINED (ref 5–15)
ANION GAP: 13 (ref 5–15)
BUN: UNDETERMINED mg/dL (ref 6–23)
BUN: 8 mg/dL (ref 6–23)
CALCIUM: UNDETERMINED mg/dL (ref 8.4–10.5)
CHLORIDE: UNDETERMINED meq/L (ref 96–112)
CHLORIDE: 104 meq/L (ref 96–112)
CO2: UNDETERMINED meq/L (ref 19–32)
CO2: 21 meq/L (ref 19–32)
CREATININE: 0.65 mg/dL (ref 0.50–1.10)
Calcium: 9.2 mg/dL (ref 8.4–10.5)
Creatinine, Ser: UNDETERMINED mg/dL (ref 0.50–1.10)
GFR calc non Af Amer: >90 mL/min (ref >90)
Glucose, Bld: UNDETERMINED mg/dL (ref 70–99)
Glucose, Bld: 91 mg/dL (ref 70–99)
POTASSIUM: 4.8 meq/L (ref 3.7–5.3)
Potassium: UNDETERMINED mEq/L (ref 3.7–5.3)
SODIUM: UNDETERMINED meq/L (ref 137–147)
Sodium: 138 mEq/L (ref 137–147)

## 2014-11-11 LAB — CBC
HCT: 37 % (ref 36.0–46.0)
HEMOGLOBIN: 12.5 g/dL (ref 12.0–15.0)
MCH: 32.4 pg (ref 26.0–34.0)
MCHC: 33.8 g/dL (ref 30.0–36.0)
MCV: 95.9 fL (ref 78.0–100.0)
Platelets: 204 10*3/uL (ref 150–400)
RBC: 3.86 MIL/uL — ABNORMAL LOW (ref 3.87–5.11)
RDW: 12.5 % (ref 11.5–15.5)
WBC: 8.9 10*3/uL (ref 4.0–10.5)

## 2014-11-11 MED ORDER — IPRATROPIUM-ALBUTEROL 0.5-2.5 (3) MG/3ML IN SOLN
3.0000 mL | Freq: Once | RESPIRATORY_TRACT | Status: AC
  Administered 2014-11-11: 3 mL via RESPIRATORY_TRACT
  Filled 2014-11-11: qty 3

## 2014-11-11 MED ORDER — IPRATROPIUM BROMIDE 0.02 % IN SOLN
0.5000 mg | Freq: Once | RESPIRATORY_TRACT | Status: AC
  Administered 2014-11-11: 0.5 mg via RESPIRATORY_TRACT
  Filled 2014-11-11: qty 2.5

## 2014-11-11 MED ORDER — ALBUTEROL SULFATE (2.5 MG/3ML) 0.083% IN NEBU
5.0000 mg | INHALATION_SOLUTION | Freq: Once | RESPIRATORY_TRACT | Status: AC
  Administered 2014-11-11: 5 mg via RESPIRATORY_TRACT
  Filled 2014-11-11: qty 6

## 2014-11-11 MED ORDER — HYDROCOD POLST-CHLORPHEN POLST 10-8 MG/5ML PO LQCR: 5.0000 mL | Freq: Two times a day (BID) | ORAL | Status: DC | PRN

## 2014-11-11 MED ORDER — ALBUTEROL SULFATE (2.5 MG/3ML) 0.083% IN NEBU: 2.5000 mg | INHALATION_SOLUTION | RESPIRATORY_TRACT | Status: DC | PRN

## 2014-11-11 NOTE — ED Notes (Signed)
Patient states her shortness of breath feels better.

## 2014-11-11 NOTE — ED Notes (Signed)
Bed: WA19 Expected date:  Expected time:  Means of arrival:  Comments: ems 

## 2014-11-11 NOTE — ED Notes (Signed)
Patient using cell phone when entering the room.

## 2014-11-11 NOTE — ED Provider Notes (Signed)
CSN: [id number]     Arrival date & time 11/11/14  1526 History   First MD Initiated Contact with Patient 11/11/14 1542     Chief Complaint  Patient presents with  . Shortness of Breath     (Consider location/radiation/quality/duration/timing/severity/associated sxs/prior Treatment) HPI Comments: Kelly Wang is a 38 y.o. female with a PMHx of asthma, who presents to the ED with complaints of shortness of breath 2 days. She states she was seen in the ED 2 days ago and discharged home with prednisone, which she has yet to fill. She has had ongoing shortness of breath consistent with her asthma, which has slightly worsened. She states that her asthma exacerbations typically come on with weather changes. She endorses a cough with thick white phlegm, unrelieved by Mucinex. She also endorses chest congestion and tightness, wheezing, and dyspnea on exertion. She has used her albuterol nebulizer treatments at home with minimal relief of symptoms, last used at 2 PM. She denies any fevers, chills, URI symptoms, ear pain, eye itching/drainage, sore throat, abdominal pain, nausea, vomiting, diarrhea, constipation, LE swelling, hemoptysis, CP, back pain, or estrogen use. States she's never been intubated for her asthma, but 2 yrs ago she required hospitalization for several days for her breathing. Smokes infrequently, 2 cigarettes every 2 days or so, but does not smoke often. 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 Denies estrogen use, recently travel, or immobilization/surgeries.  Patient is a 38 y.o. female presenting with shortness of breath. The history is provided by the patient. No language interpreter was used.  Shortness of Breath Severity:  Moderate Onset quality:  Gradual Duration:  2 days Timing:  Intermittent Progression:  Worsening Chronicity:  Recurrent Context: weather changes   Relieved by:  Nothing Worsened by:  Weather changes Ineffective  treatments:  Inhaler and rest Associated symptoms: cough, sputum production (white and thick) and wheezing   Associated symptoms: no abdominal pain, no chest pain, no claudication, no ear pain, no fever, no headaches, no hemoptysis, no neck pain, no PND, no rash, no sore throat, no syncope, no swollen glands and no vomiting   Risk factors: no oral contraceptive use     Past Medical History  Diagnosis Date  . Asthma    Past Surgical History  Procedure Laterality Date  . Breast surgery     Family History  Problem Relation Age of Onset  . Emphysema Maternal Grandfather   . Heart disease Maternal Grandmother   . Diabetes Maternal Grandmother   . Kidney failure Maternal Grandmother    History  Substance Use Topics  . Smoking status: Current Some Day Smoker -- 0.30 packs/day for 6 years    Types: Cigarettes  . Smokeless tobacco: Not on file     Comment: pt states she smokes a signal cig occasionally a month  . Alcohol Use: Yes     Comment: occasional drinker   OB History    No data available     Review of Systems  Constitutional: Negative for fever and chills.  HENT: Negative for congestion, ear pain, rhinorrhea, sinus pressure, sore throat and trouble swallowing.   Respiratory: Positive for cough, sputum production (white and thick), chest tightness, shortness of breath and wheezing. Negative for hemoptysis.   Cardiovascular: Negative for chest pain, claudication, leg swelling, syncope and PND.  Gastrointestinal: Negative for nausea, vomiting, abdominal pain, diarrhea and constipation.  Musculoskeletal: Negative for myalgias, arthralgias and neck pain.  Skin: Negative for rash.  Allergic/Immunologic: Negative for immunocompromised state.  Neurological: Negative for dizziness, syncope, weakness, light-headedness, numbness and headaches.  Psychiatric/Behavioral: Negative for confusion.   10 Systems reviewed and are negative for acute change except as noted in the HPI.     Allergies  Review of patient's allergies indicates no known allergies.  Home Medications   Prior to Admission medications   Medication Sig Start Date End Date Taking? Authorizing Provider  acetaminophen (TYLENOL) 500 MG tablet Take 500 mg by mouth every 6 (six) hours as needed for moderate pain.   Yes Historical Provider, MD  albuterol (PROVENTIL HFA;VENTOLIN HFA) 108 (90 BASE) MCG/ACT inhaler Inhale 2 puffs into the lungs every 4 (four) hours as needed for wheezing or shortness of breath.   Yes Historical Provider, MD  albuterol (PROVENTIL) (2.5 MG/3ML) 0.083% nebulizer solution USE ONE VIAL IN NEBULIZER EVERY 6 HOURS AS NEEDED FOR WHEEZING 10/25/14  Yes Kalman Shan, MD  guaiFENesin (MUCINEX) 600 MG 12 hr tablet Take 1,200 mg by mouth 2 (two) times daily.   Yes Historical Provider, MD  HYDROcodone-homatropine (HYCODAN) 5-1.5 MG/5ML syrup Take 5 mLs by mouth every 6 (six) hours as needed for cough. 01/01/14   Antony Madura, PA-C  predniSONE (DELTASONE) 10 MG tablet Take 2 tablets (20 mg total) by mouth daily. 11/09/14   Toy Baker, MD   BP 100/87 mmHg  Pulse 90  Temp(Src) 97.9 F (36.6 C) (Oral)  Resp 20  SpO2 97%  LMP 10/23/2014 Physical Exam  Constitutional: She is oriented to person, place, and time. Vital signs are normal. She appears well-developed and well-nourished.  Non-toxic appearance. She does not have a sickly appearance. No distress.  Afebrile, nontoxic, speaks in full sentences but becomes dyspneic by the end of the sentence. NAD but does appear SOB  HENT:  Head: Normocephalic and atraumatic.  Mouth/Throat: Oropharynx is clear and moist and mucous membranes are normal.  Eyes: Conjunctivae and EOM are normal. Right eye exhibits no discharge. Left eye exhibits no discharge.  Neck: Normal range of motion. Neck supple.  Cardiovascular: Normal rate, regular rhythm, normal heart sounds and intact distal pulses.  Exam reveals no gallop and no friction rub.   No murmur  heard. Pulmonary/Chest: Effort normal. No respiratory distress. She has no decreased breath sounds. She has wheezes. She has no rhonchi. She has no rales. She exhibits no bony tenderness, no crepitus and no retraction.  Diffuse end expiratory wheezes in all lung fields. Becomes dyspneic at ends of sentences, but able to speak in full sentences. Oxygenation 97% on RA  Abdominal: Soft. Normal appearance and bowel sounds are normal. She exhibits no distension. There is no tenderness. There is no rigidity, no rebound, no guarding, no CVA tenderness, no tenderness at McBurney's point and negative Murphy's sign.  Musculoskeletal: Normal range of motion.  Neurological: She is alert and oriented to person, place, and time. She has normal strength. No sensory deficit.  Skin: Skin is warm, dry and intact. No rash noted.  Psychiatric: She has a normal mood and affect.  Nursing note and vitals reviewed.   ED Course  Procedures (including critical care time) Labs Review Labs Reviewed  CBC - Abnormal; Notable for the following:    RBC 3.86 (*)    All other components within normal limits  BLOOD GAS, VENOUS - Abnormal; Notable for the following:    pH, Ven 7.328 (*)    pCO2, Ven 38.1 (*)    Bicarbonate 19.4 (*)    Acid-base deficit 5.5 (*)  All other components within normal limits  BASIC METABOLIC PANEL  BASIC METABOLIC PANEL  BLOOD GAS, VENOUS    Imaging Review Dg Chest 2 View (if Patient Has Fever And/or Copd)  11/11/2014   CLINICAL DATA:  Shortness of breath, asthma  EXAM: CHEST  2 VIEW  COMPARISON:  11/09/2014  FINDINGS: There is no focal parenchymal opacity, pleural effusion, or pneumothorax. The heart and mediastinal contours are unremarkable.  The osseous structures are unremarkable.  IMPRESSION: No active cardiopulmonary disease.   Electronically Signed   By: Elige KoHetal  Patel   On: 11/11/2014 16:48     EKG Interpretation   Date/Time:  Monday November 11 2014 15:53:54 EST Ventricular  Rate:  93 PR Interval:  124 QRS Duration: 70 QT Interval:  415 QTC Calculation: 516 R Axis:   70 Text Interpretation:  Sinus arrhythmia No significant change since last  tracing Confirmed by Mirian MoGentry, Matthew [phone]([id number]) on 11/11/2014 5:00:52 PM      MDM   Final diagnoses:  Moderate persistent asthma, with acute exacerbation  Wheezing  Cough  SOB (shortness of breath)    37y/o female with asthma exacerbation, seen 2 days ago for same but had not been able to start prednisone tx. Afebrile, but does appear slightly dyspneic after a full sentences. Will obtain basic labs and CXR. EKG WNL. Doubt PE. Will give nebs and reassess. Given solumedrol en route, therefore will hold on giving decadron.   4:53 PM CXR unremarkable. CBC unremarkable. BMP showing CO2 18, AG 19 therefore will obtain VBG.  6:55 PM Appears that BMP initially was incorrect. Repeat BMP unremarkable. VBG unconcering although multiple values are pending but doubt pt is in metabolic derangement from breathing difficulties. Respirations improved, no longer dyspeic, mild wheezing still in lower fields, will repeat another duoneb, ambulate, and plan for d/c.  7:52 PM  Wheezing improved after second duoneb. Ambulated without difficulty or desats. Will rx tussionex at her request, but discussed f/up with her pulmonologist to have PFTs and get back on her QVar. Discussed importance of taking the prednisone prescribed previously. Discussed home use of nebulizer tx, and given refill. I explained the diagnosis and have given explicit precautions to return to the ER including for any other new or worsening symptoms. The patient understands and accepts the medical plan as it's been dictated and I have answered their questions. Discharge instructions concerning home care and prescriptions have been given. The patient is STABLE and is discharged to home in good condition.  BP 103/64 mmHg  Pulse 82  Temp(Src) 98.5 F (36.9 C) (Oral)  Resp 20   SpO2 99%  LMP 10/23/2014  Meds ordered this encounter  Medications  . ipratropium-albuterol (DUONEB) 0.5-2.5 (3) MG/3ML nebulizer solution 3 mL    Sig:   . albuterol (PROVENTIL) (2.5 MG/3ML) 0.083% nebulizer solution 5 mg    Sig:   . ipratropium (ATROVENT) nebulizer solution 0.5 mg    Sig:   . albuterol (PROVENTIL) (2.5 MG/3ML) 0.083% nebulizer solution    Sig: Take 3 mLs (2.5 mg total) by nebulization every 4 (four) hours as needed for wheezing or shortness of breath.    Dispense:  30 vial    Refill:  0    Order Specific Question:  Supervising Provider    Answer:  Eber HongMILLER, BRIAN D [[id number]]  . chlorpheniramine-HYDROcodone (TUSSIONEX) 10-8 MG/5ML LQCR    Sig: Take 5 mLs by mouth every 12 (twelve) hours as needed for cough.    Dispense:  70 mL  Refill:  0    Order Specific Question:  Supervising Provider    Answer:  Vida RollerMILLER, BRIAN D [address][[id number]]     Sachit Gilman Strupp Flandersamprubi-Soms, PA-C 11/11/14 1953  Mirian MoMatthew Gentry, MD 11/13/14 [phone]

## 2014-11-11 NOTE — ED Notes (Signed)
Venous blood gas sitting in mini lab. RT aware they need to run test.

## 2014-11-11 NOTE — ED Notes (Signed)
Pt ambulated at 99% room air.

## 2014-11-11 NOTE — ED Notes (Signed)
Brought in by EMS from home with c/o shortness of breath.  Pt reported that she has been having shortness of breath since this morning.  Pt had taken her asthma medications but without relief.  Pt was given Albuterol 5 mg and Atrovent 0.5 mg per neb treatment, and Solu-Medrol 125 mg IV en route to ED.

## 2014-11-11 NOTE — Discharge Instructions (Signed)
Use your nebulizers as directed to help with your shortness of breath and wheezing. Use tussionex as directed as needed for cough. TAKE YOUR PREDNISONE as directed by the previous provider. Stay well hydrated, get plenty of rest. See your pulmonlogist as soon as possible for further care. Return to the ER for changes or worsening of symptoms.   Asthma Asthma is a condition of the lungs in which the airways tighten and narrow. Asthma can make it hard to breathe. Asthma cannot be cured, but medicine and lifestyle changes can help control it. Asthma may be started (triggered) by:  Animal skin flakes (dander).  Dust.  Cockroaches.  Pollen.  Mold.  Smoke.  Cleaning products.  Hair sprays or aerosol sprays.  Paint fumes or strong smells.  Cold air, weather changes, and winds.  Crying or laughing hard.  Stress.  Certain medicines or drugs.  Foods, such as dried fruit, potato chips, and sparkling grape juice.  Infections or conditions (colds, flu).  Exercise.  Certain medical conditions or diseases.  Exercise or tiring activities. HOME CARE   Take medicine as told by your doctor.  Use a peak flow meter as told by your doctor. A peak flow meter is a tool that measures how well the lungs are working.  Record and keep track of the peak flow meter's readings.  Understand and use the asthma action plan. An asthma action plan is a written plan for taking care of your asthma and treating your attacks.  To help prevent asthma attacks:  Do not smoke. Stay away from secondhand smoke.  Change your heating and air conditioning filter often.  Limit your use of fireplaces and wood stoves.  Get rid of pests (such as roaches and mice) and their droppings.  Throw away plants if you see mold on them.  Clean your floors. Dust regularly. Use cleaning products that do not smell.  Have someone vacuum when you are not home. Use a vacuum cleaner with a HEPA filter if  possible.  Replace carpet with wood, tile, or vinyl flooring. Carpet can trap animal skin flakes and dust.  Use allergy-proof pillows, mattress covers, and box spring covers.  Wash bed sheets and blankets every week in hot water and dry them in a dryer.  Use blankets that are made of polyester or cotton.  Clean bathrooms and kitchens with bleach. If possible, have someone repaint the walls in these rooms with mold-resistant paint. Keep out of the rooms that are being cleaned and painted.  Wash hands often. GET HELP IF:  You have make a whistling sound when breaking (wheeze), have shortness of breath, or have a cough even if taking medicine to prevent attacks.  The colored mucus you cough up (sputum) is thicker than usual.  The colored mucus you cough up changes from clear or white to yellow, green, gray, or bloody.  You have problems from the medicine you are taking such as:  A rash.  Itching.  Swelling.  Trouble breathing.  You need reliever medicines more than 2-3 times a week.  Your peak flow measurement is still at 50-79% of your personal best after following the action plan for 1 hour.  You have a fever. GET HELP RIGHT AWAY IF:   You seem to be worse and are not responding to medicine during an asthma attack.  You are short of breath even at rest.  You get short of breath when doing very little activity.  You have trouble eating, drinking, or talking.  You have chest pain.  You have a fast heartbeat.  Your lips or fingernails start to turn blue.  You are light-headed, dizzy, or faint.  Your peak flow is less than 50% of your personal best. MAKE SURE YOU:   Understand these instructions.  Will watch your condition.  Will get help right away if you are not doing well or get worse. Document Released: 05/31/2008 Document Revised: 04/29/2014 Document Reviewed: 07/12/2013 [facility] Patient Information 2015 [place]. This information is not intended  to replace advice given to you by your health care provider. Make sure you discuss any questions you have with your health care provider.  Asthma Attack Prevention Although there is no way to prevent asthma from starting, you can take steps to control the disease and reduce its symptoms. Learn about your asthma and how to control it. Take an active role to control your asthma by working with your health care provider to create and follow an asthma action plan. An asthma action plan guides you in:  Taking your medicines properly.  Avoiding things that set off your asthma or make your asthma worse (asthma triggers).  Tracking your level of asthma control.  Responding to worsening asthma.  Seeking emergency care when needed. To track your asthma, keep records of your symptoms, check your peak flow number using a handheld device that shows how well air moves out of your lungs (peak flow meter), and get regular asthma checkups.  WHAT ARE SOME WAYS TO PREVENT AN ASTHMA ATTACK?  Take medicines as directed by your health care provider.  Keep track of your asthma symptoms and level of control.  With your health care provider, write a detailed plan for taking medicines and managing an asthma attack. Then be sure to follow your action plan. Asthma is an ongoing condition that needs regular monitoring and treatment.  Identify and avoid asthma triggers. Many outdoor allergens and irritants (such as pollen, mold, cold air, and air pollution) can trigger asthma attacks. Find out what your asthma triggers are and take steps to avoid them.  Monitor your breathing. Learn to recognize warning signs of an attack, such as coughing, wheezing, or shortness of breath. Your lung function may decrease before you notice any signs or symptoms, so regularly measure and record your peak airflow with a home peak flow meter.  Identify and treat attacks early. If you act quickly, you are less likely to have a severe  attack. You will also need less medicine to control your symptoms. When your peak flow measurements decrease and alert you to an upcoming attack, take your medicine as instructed and immediately stop any activity that may have triggered the attack. If your symptoms do not improve, get medical help.  Pay attention to increasing quick-relief inhaler use. If you find yourself relying on your quick-relief inhaler, your asthma is not under control. See your health care provider about adjusting your treatment. WHAT CAN MAKE MY SYMPTOMS WORSE? A number of common things can set off or make your asthma symptoms worse and cause temporary increased inflammation of your airways. Keep track of your asthma symptoms for several weeks, detailing all the environmental and emotional factors that are linked with your asthma. When you have an asthma attack, go back to your asthma diary to see which factor, or combination of factors, might have contributed to it. Once you know what these factors are, you can take steps to control many of them. If you have allergies and asthma, it is  important to take asthma prevention steps at home. Minimizing contact with the substance to which you are allergic will help prevent an asthma attack. Some triggers and ways to avoid these triggers are: Animal Dander:  Some people are allergic to the flakes of skin or dried saliva from animals with fur or feathers.   There is no such thing as a hypoallergenic dog or cat breed. All dogs or cats can cause allergies, even if they don't shed.  Keep these pets out of your home.  If you are not able to keep a pet outdoors, keep the pet out of your bedroom and other sleeping areas at all times, and keep the door closed.  Remove carpets and furniture covered with cloth from your home. If that is not possible, keep the pet away from fabric-covered furniture and carpets. Dust Mites: Many people with asthma are allergic to dust mites. Dust mites are  tiny bugs that are found in every home in mattresses, pillows, carpets, fabric-covered furniture, bedcovers, clothes, stuffed toys, and other fabric-covered items.   Cover your mattress in a special dust-proof cover.  Cover your pillow in a special dust-proof cover, or wash the pillow each week in hot water. Water must be hotter than 130 F (54.4 C) to kill dust mites. Cold or warm water used with detergent and bleach can also be effective.  Wash the sheets and blankets on your bed each week in hot water.  Try not to sleep or lie on cloth-covered cushions.  Call ahead when traveling and ask for a smoke-free hotel room. Bring your own bedding and pillows in case the hotel only supplies feather pillows and down comforters, which may contain dust mites and cause asthma symptoms.  Remove carpets from your bedroom and those laid on concrete, if you can.  Keep stuffed toys out of the bed, or wash the toys weekly in hot water or cooler water with detergent and bleach. Cockroaches: Many people with asthma are allergic to the droppings and remains of cockroaches.   Keep food and garbage in closed containers. Never leave food out.  Use poison baits, traps, powders, gels, or paste (for example, boric acid).  If a spray is used to kill cockroaches, stay out of the room until the odor goes away. Indoor Mold:  Fix leaky faucets, pipes, or other sources of water that have mold around them.  Clean floors and moldy surfaces with a fungicide or diluted bleach.  Avoid using humidifiers, vaporizers, or swamp coolers. These can spread molds through the air. Pollen and Outdoor Mold:  When pollen or mold spore counts are high, try to keep your windows closed.  Stay indoors with windows closed from late morning to afternoon. Pollen and some mold spore counts are highest at that time.  Ask your health care provider whether you need to take anti-inflammatory medicine or increase your dose of the medicine  before your allergy season starts. Other Irritants to Avoid:  Tobacco smoke is an irritant. If you smoke, ask your health care provider how you can quit. Ask family members to quit smoking, too. Do not allow smoking in your home or car.  If possible, do not use a wood-burning stove, kerosene heater, or fireplace. Minimize exposure to all sources of smoke, including incense, candles, fires, and fireworks.  Try to stay away from strong odors and sprays, such as perfume, talcum powder, hair spray, and paints.  Decrease humidity in your home and use an indoor air cleaning device. Reduce  indoor humidity to below 60%. Dehumidifiers or central air conditioners can do this.  Decrease house dust exposure by changing furnace and air cooler filters frequently.  Try to have someone else vacuum for you once or twice a week. Stay out of rooms while they are being vacuumed and for a short while afterward.  If you vacuum, use a dust mask from a hardware store, a double-layered or microfilter vacuum cleaner bag, or a vacuum cleaner with a HEPA filter.  Sulfites in foods and beverages can be irritants. Do not drink beer or wine or eat dried fruit, processed potatoes, or shrimp if they cause asthma symptoms.  Cold air can trigger an asthma attack. Cover your nose and mouth with a scarf on cold or windy days.  Several health conditions can make asthma more difficult to manage, including a runny nose, sinus infections, reflux disease, psychological stress, and sleep apnea. Work with your health care provider to manage these conditions.  Avoid close contact with people who have a respiratory infection such as a cold or the flu, since your asthma symptoms may get worse if you catch the infection. Wash your hands thoroughly after touching items that may have been handled by people with a respiratory infection.  Get a flu shot every year to protect against the flu virus, which often makes asthma worse for days or  weeks. Also get a pneumonia shot if you have not previously had one. Unlike the flu shot, the pneumonia shot does not need to be given yearly. Medicines:  Talk to your health care provider about whether it is safe for you to take aspirin or non-steroidal anti-inflammatory medicines (NSAIDs). In a small number of people with asthma, aspirin and NSAIDs can cause asthma attacks. These medicines must be avoided by people who have known aspirin-sensitive asthma. It is important that people with aspirin-sensitive asthma read labels of all over-the-counter medicines used to treat pain, colds, coughs, and fever.  Beta-blockers and ACE inhibitors are other medicines you should discuss with your health care provider. HOW CAN I FIND OUT WHAT I AM ALLERGIC TO? Ask your asthma health care provider about allergy skin testing or blood testing (the RAST test) to identify the allergens to which you are sensitive. If you are found to have allergies, the most important thing to do is to try to avoid exposure to any allergens that you are sensitive to as much as possible. Other treatments for allergies, such as medicines and allergy shots (immunotherapy) are available.  CAN I EXERCISE? Follow your health care provider's advice regarding asthma treatment before exercising. It is important to maintain a regular exercise program, but vigorous exercise or exercise in cold, humid, or dry environments can cause asthma attacks, especially for those people who have exercise-induced asthma. Document Released: 12/01/2009 Document Revised: 12/18/2013 Document Reviewed: 06/20/2013 [facility] Patient Information 2015 [place], [place]. This information is not intended to replace advice given to you by your health care provider. Make sure you discuss any questions you have with your health care provider.  Cough, Adult  A cough is a reflex. It helps you clear your throat and airways. A cough can help heal your body. A cough can last 2 or 3  weeks (acute) or may last more than 8 weeks (chronic). Some common causes of a cough can include an infection, allergy, or a cold. HOME CARE  Only take medicine as told by your doctor.  If given, take your medicines (antibiotics) as told. Finish them even if  you start to feel better.  Use a cold steam vaporizer or humidifier in your home. This can help loosen thick spit (secretions).  Sleep so you are almost sitting up (semi-upright). Use pillows to do this. This helps reduce coughing.  Rest as needed.  Stop smoking if you smoke. GET HELP RIGHT AWAY IF:  You have yellowish-white fluid (pus) in your thick spit.  Your cough gets worse.  Your medicine does not reduce coughing, and you are losing sleep.  You cough up blood.  You have trouble breathing.  Your pain gets worse and medicine does not help.  You have a fever. MAKE SURE YOU:   Understand these instructions.  Will watch your condition.  Will get help right away if you are not doing well or get worse. Document Released: 08/26/2011 Document Revised: 04/29/2014 Document Reviewed: 08/26/2011 [facility] Patient Information 2015 [place], [place]. This information is not intended to replace advice given to you by your health care provider. Make sure you discuss any questions you have with your health care provider.   Shortness of Breath Shortness of breath means you have trouble breathing. It could also mean that you have a medical problem. You should get immediate medical care for shortness of breath. CAUSES   Not enough oxygen in the air such as with high altitudes or a smoke-filled room.  Certain lung diseases, infections, or problems.  Heart disease or conditions, such as angina or heart failure.  Low red blood cells (anemia).  Poor physical fitness, which can cause shortness of breath when you exercise.  Chest or back injuries or stiffness.  Being overweight.  Smoking.  Anxiety, which can make you feel like  you are not getting enough air. DIAGNOSIS  Serious medical problems can often be found during your physical exam. Tests may also be done to determine why you are having shortness of breath. Tests may include:  Chest X-rays.  Lung function tests.  Blood tests.  An electrocardiogram (ECG).  An ambulatory electrocardiogram. An ambulatory ECG records your heartbeat patterns over a 24-hour period.  Exercise testing.  A transthoracic echocardiogram (TTE). During echocardiography, sound waves are used to evaluate how blood flows through your heart.  A transesophageal echocardiogram (TEE).  Imaging scans. Your health care provider may not be able to find a cause for your shortness of breath after your exam. In this case, it is important to have a follow-up exam with your health care provider as directed.  TREATMENT  Treatment for shortness of breath depends on the cause of your symptoms and can vary greatly. HOME CARE INSTRUCTIONS   Do not smoke. Smoking is a common cause of shortness of breath. If you smoke, ask for help to quit.  Avoid being around chemicals or things that may bother your breathing, such as paint fumes and dust.  Rest as needed. Slowly resume your usual activities.  If medicines were prescribed, take them as directed for the full length of time directed. This includes oxygen and any inhaled medicines.  Keep all follow-up appointments as directed by your health care provider. SEEK MEDICAL CARE IF:   Your condition does not improve in the time expected.  You have a hard time doing your normal activities even with rest.  You have any new symptoms. SEEK IMMEDIATE MEDICAL CARE IF:   Your shortness of breath gets worse.  You feel light-headed, faint, or develop a cough not controlled with medicines.  You start coughing up blood.  You have pain with breathing.  You have chest pain or pain in your arms, shoulders, or abdomen.  You have a fever.  You are  unable to walk up stairs or exercise the way you normally do. MAKE SURE YOU:  Understand these instructions.  Will watch your condition.  Will get help right away if you are not doing well or get worse. Document Released: 09/07/2001 Document Revised: 12/18/2013 Document Reviewed: 02/28/2012 [facility] Patient Information 2015 [place], [place]. This information is not intended to replace advice given to you by your health care provider. Make sure you discuss any questions you have with your health care provider.

## 2014-11-12 LAB — BLOOD GAS, VENOUS
Acid-base deficit: 5.5 mmol/L — ABNORMAL HIGH (ref 0.0–2.0)
Bicarbonate: 19.4 mEq/L — ABNORMAL LOW (ref 20.0–24.0)
FIO2: 0.21 %
O2 SAT: 42.1 %
PATIENT TEMPERATURE: 98.6
PH VEN: 7.328 — AB (ref 7.250–7.300)
TCO2: 18 mmol/L (ref 0–100)
pCO2, Ven: 38.1 mmHg — ABNORMAL LOW (ref 45.0–50.0)

## 2015-08-28 ENCOUNTER — Emergency Department (HOSPITAL_COMMUNITY): Payer: Self-pay

## 2015-08-28 ENCOUNTER — Encounter (HOSPITAL_COMMUNITY): Payer: Self-pay | Admitting: Emergency Medicine

## 2015-08-28 ENCOUNTER — Emergency Department (HOSPITAL_COMMUNITY)
Admission: EM | Admit: 2015-08-28 | Discharge: 2015-08-28 | Disposition: A | Discharge status: Home / Self Care | Payer: Self-pay | Attending: Emergency Medicine | Admitting: Emergency Medicine

## 2015-08-28 DIAGNOSIS — Z79899 Other long term (current) drug therapy: Secondary | ICD-10-CM | POA: Insufficient documentation

## 2015-08-28 DIAGNOSIS — Z720 Tobacco use: Secondary | ICD-10-CM | POA: Insufficient documentation

## 2015-08-28 DIAGNOSIS — J45901 Unspecified asthma with (acute) exacerbation: Secondary | ICD-10-CM | POA: Insufficient documentation

## 2015-08-28 LAB — CBC WITH DIFFERENTIAL/PLATELET
BASOS ABS: 0 10*3/uL (ref 0.0–0.1)
Basophils Relative: 0 % (ref 0–1)
EOS ABS: 0.1 10*3/uL (ref 0.0–0.7)
EOS PCT: 2 % (ref 0–5)
HCT: 37 % (ref 36.0–46.0)
HEMOGLOBIN: 12.4 g/dL (ref 12.0–15.0)
LYMPHS ABS: 1.5 10*3/uL (ref 0.7–4.0)
Lymphocytes Relative: 20 % (ref 12–46)
MCH: 32.5 pg (ref 26.0–34.0)
MCHC: 33.5 g/dL (ref 30.0–36.0)
MCV: 96.9 fL (ref 78.0–100.0)
Monocytes Absolute: 0.7 10*3/uL (ref 0.1–1.0)
Monocytes Relative: 9 % (ref 3–12)
NEUTROS PCT: 69 % (ref 43–77)
Neutro Abs: 5.2 10*3/uL (ref 1.7–7.7)
PLATELETS: 306 10*3/uL (ref 150–400)
RBC: 3.82 MIL/uL — AB (ref 3.87–5.11)
RDW: 12.8 % (ref 11.5–15.5)
WBC: 7.5 10*3/uL (ref 4.0–10.5)

## 2015-08-28 LAB — BASIC METABOLIC PANEL
ANION GAP: 6 (ref 5–15)
BUN: 10 mg/dL (ref 6–20)
CHLORIDE: 108 mmol/L (ref 101–111)
CO2: 22 mmol/L (ref 22–32)
Calcium: 8.8 mg/dL — ABNORMAL LOW (ref 8.9–10.3)
Creatinine, Ser: 0.83 mg/dL (ref 0.44–1.00)
Glucose, Bld: 84 mg/dL (ref 65–99)
POTASSIUM: 4.1 mmol/L (ref 3.5–5.1)
SODIUM: 136 mmol/L (ref 135–145)

## 2015-08-28 MED ORDER — ALBUTEROL (5 MG/ML) CONTINUOUS INHALATION SOLN
10.0000 mg/h | INHALATION_SOLUTION | Freq: Once | RESPIRATORY_TRACT | Status: AC
  Administered 2015-08-28: 10 mg/h via RESPIRATORY_TRACT
  Filled 2015-08-28: qty 20

## 2015-08-28 MED ORDER — PREDNISONE 20 MG PO TABS: 20.0000 mg | ORAL_TABLET | Freq: Two times a day (BID) | ORAL | Status: DC

## 2015-08-28 MED ORDER — METHYLPREDNISOLONE SODIUM SUCC 125 MG IJ SOLR
125.0000 mg | Freq: Once | INTRAMUSCULAR | Status: AC
  Administered 2015-08-28: 125 mg via INTRAVENOUS
  Filled 2015-08-28: qty 2

## 2015-08-28 MED ORDER — ALBUTEROL SULFATE (2.5 MG/3ML) 0.083% IN NEBU: 2.5000 mg | INHALATION_SOLUTION | Freq: Four times a day (QID) | RESPIRATORY_TRACT | Status: DC | PRN

## 2015-08-28 MED ORDER — ALBUTEROL SULFATE HFA 108 (90 BASE) MCG/ACT IN AERS: 1.0000 | INHALATION_SPRAY | Freq: Four times a day (QID) | RESPIRATORY_TRACT | Status: DC | PRN

## 2015-08-28 MED ORDER — BUDESONIDE 90 MCG/ACT IN AEPB: 2.0000 | INHALATION_SPRAY | Freq: Two times a day (BID) | RESPIRATORY_TRACT | Status: DC

## 2015-08-28 MED ORDER — ALBUTEROL SULFATE HFA 108 (90 BASE) MCG/ACT IN AERS
2.0000 | INHALATION_SPRAY | RESPIRATORY_TRACT | Status: DC | PRN
  Filled 2015-08-28: qty 6.7

## 2015-08-28 NOTE — ED Notes (Signed)
MD at bedside. 

## 2015-08-28 NOTE — ED Notes (Signed)
Pt with Hx of asthma c/o asthma exacerbation mild onset yesterday with significant worsening 1 hour ago. Pt has mild respiratory distress, significant inspiratory and expiratory wheezing. Pt uses albuterol at home for flare-ups. Self-treated yesterday without success. Pt c/o chest and back tightness.

## 2015-08-28 NOTE — ED Notes (Signed)
MD wants to finish current neb, give pt a 30 min break then try a 2nd cont neb.

## 2015-08-28 NOTE — Discharge Instructions (Signed)

## 2015-08-28 NOTE — ED Provider Notes (Signed)
CSN: [id number]     Arrival date & time 08/28/15  4098 History   First MD Initiated Contact with Patient 08/28/15 [phone]     Chief Complaint  Patient presents with  . Asthma      HPI  Patient presents for evaluation of difficulty breathing. History of asthma. States she was first diagnosed 2 or 3 years ago. Has been admitted one time. His never been in ICU, or intubated. Previous on daily meds. States she had done well for so long she is only taking albuterol when necessary. 2 days ago started feeling some shortness of breath. Yesterday used her nebulizer and felt better and was able to "get through the day". She awakened this morning and felt suddenly more short of breath. Did not use her albuterol at home. She presents here.  Scribed a lot of environment allergies. No recent fevers or chills. Did have recent dental infection.     Past Medical History  Diagnosis Date  . Asthma    Past Surgical History  Procedure Laterality Date  . Breast surgery     Family History  Problem Relation Age of Onset  . Emphysema Maternal Grandfather   . Heart disease Maternal Grandmother   . Diabetes Maternal Grandmother   . Kidney failure Maternal Grandmother    Social History  Substance Use Topics  . Smoking status: Current Some Day Smoker -- 0.30 packs/day for 6 years    Types: Cigarettes  . Smokeless tobacco: None     Comment: pt states she smokes a signal cig occasionally a month  . Alcohol Use: Yes     Comment: occasional drinker   OB History    No data available     Review of Systems  Constitutional: Negative for fever, chills, diaphoresis, appetite change and fatigue.  HENT: Negative for mouth sores, sore throat and trouble swallowing.   Eyes: Negative for visual disturbance.  Respiratory: Positive for shortness of breath and wheezing. Negative for cough and chest tightness.   Cardiovascular: Negative for chest pain.  Gastrointestinal: Negative for nausea, vomiting, abdominal pain,  diarrhea and abdominal distention.  Endocrine: Negative for polydipsia, polyphagia and polyuria.  Genitourinary: Negative for dysuria, frequency and hematuria.  Musculoskeletal: Negative for gait problem.  Skin: Negative for color change, pallor and rash.  Neurological: Negative for dizziness, syncope, light-headedness and headaches.  Hematological: Does not bruise/bleed easily.  Psychiatric/Behavioral: Negative for behavioral problems and confusion.      Allergies  Review of patient's allergies indicates no known allergies.  Home Medications   Prior to Admission medications   Medication Sig Start Date End Date Taking? Authorizing Provider  loratadine (CLARITIN) 10 MG tablet Take 10 mg by mouth daily as needed for allergies.   Yes Historical Provider, MD  albuterol (PROVENTIL HFA;VENTOLIN HFA) 108 (90 BASE) MCG/ACT inhaler Inhale 1-2 puffs into the lungs every 6 (six) hours as needed for wheezing. 08/28/15   Rolland Porter, MD  albuterol (PROVENTIL) (2.5 MG/3ML) 0.083% nebulizer solution Take 3 mLs (2.5 mg total) by nebulization every 6 (six) hours as needed for wheezing or shortness of breath. 08/28/15   Rolland Porter, MD  Budesonide (PULMICORT FLEXHALER) 90 MCG/ACT inhaler Inhale 2 puffs into the lungs 2 (two) times daily. 08/28/15   Rolland Porter, MD  predniSONE (DELTASONE) 20 MG tablet Take 1 tablet (20 mg total) by mouth 2 (two) times daily with a meal. 08/28/15   Rolland Porter, MD   BP 130/78 mmHg  Pulse 112  Temp(Src) 98.7 F (37.1  C) (Oral)  Resp 24  SpO2 99%  LMP 08/21/2015 Physical Exam  Constitutional: She is oriented to person, place, and time. She appears well-developed and well-nourished. No distress.  HENT:  Head: Normocephalic.  Eyes: Conjunctivae are normal. Pupils are equal, round, and reactive to light. No scleral icterus.  Neck: Normal range of motion. Neck supple. No thyromegaly present.  Cardiovascular: Normal rate and regular rhythm.  Exam reveals no gallop and no friction  rub.   No murmur heard. Pulmonary/Chest: Effort normal. No respiratory distress. She has decreased breath sounds. She has wheezes. She has no rales.  Tachypneic. Speaking 3-4 words at a time. Globally diminished breath sounds.  Abdominal: Soft. Bowel sounds are normal. She exhibits no distension. There is no tenderness. There is no rebound.  Musculoskeletal: Normal range of motion.  Neurological: She is alert and oriented to person, place, and time.  Skin: Skin is warm and dry. No rash noted.  Psychiatric: She has a normal mood and affect. Her behavior is normal.    ED Course  Procedures (including critical care time) Labs Review Labs Reviewed  CBC WITH DIFFERENTIAL/PLATELET - Abnormal; Notable for the following:    RBC 3.82 (*)    All other components within normal limits  BASIC METABOLIC PANEL - Abnormal; Notable for the following:    Calcium 8.8 (*)    All other components within normal limits    Imaging Review Dg Chest 2 View  08/28/2015   CLINICAL DATA:  Asthma with shortness of breath.  EXAM: CHEST  2 VIEW  COMPARISON:  11/11/2014  FINDINGS: Normal heart size and mediastinal contours. No acute infiltrate or edema. No effusion or pneumothorax. No acute osseous findings.  IMPRESSION: Negative chest.   Electronically Signed   By: Marnee Spring M.D.   On: 08/28/2015 09:09   I have personally reviewed and evaluated these images and lab results as part of my medical decision-making.   EKG Interpretation None      MDM   Final diagnoses:  Asthma exacerbation    Placed on continuous nebulized albuterol. Plan saline lock and IV steroids. Reevaluation.  CRITICAL CARE Performed by: Rolland Porter JOSEPH   Total critical care time: 33 Minutes of continuous nebulized albuterol  Critical care time was exclusive of separately billable procedures and treating other patients.  Critical care was necessary to treat or prevent imminent or life-threatening deterioration.  Critical  care was time spent personally by me on the following activities: development of treatment plan with patient and/or surrogate as well as nursing, discussions with consultants, evaluation of patient's response to treatment, examination of patient, obtaining history from patient or surrogate, ordering and performing treatments and interventions, ordering and review of laboratory studies, ordering and review of radiographic studies, pulse oximetry and re-evaluation of patient's condition.   Heart recheck patient still tachycardic at 110 but wheezing improved. Observed for an additional 45 minutes. "Shakiness" and tachycardia have resolved. Well oxygenated ambulatory. Appropriate for discharge home. Clear lungs. Plan is home, Pulmicort MDI, prednisone, albuterol MDI and nebulizer solution refills. Urged primary care follow-up.    Rolland Porter, MD 08/28/15 [phone]

## 2015-08-28 NOTE — Progress Notes (Signed)
CM spoke with pt who confirms uninsured [place] resident with no pcp.  CM discussed and provided written information for uninsured accepting pcps, discussed the importance of pcp vs EDP services for f/u care, www.needymeds.org, www.goodrx.com, discounted pharmacies and other [place] resources such as [facility] , [facility], affordable care act, financial assistance, uninsured dental services, [facility], DSS and  health department  Reviewed resources for [place] uninsured accepting pcps like [facility], [facility] [address], [facility], [facility], local urgent care centers, [facility], [facility], general [facility] clinics, [facility], [facility] plus others, medication resources, [facility] out patient [facility]ies and housing Pt voiced understanding and appreciation of resources provided   Provided [facility] contact information Pt agreed to a referral Cm completed referral Pt to be contact by [facility] clinical liason  

## 2015-10-17 ENCOUNTER — Encounter (HOSPITAL_COMMUNITY): Payer: Self-pay | Admitting: Emergency Medicine

## 2015-10-17 ENCOUNTER — Emergency Department (HOSPITAL_COMMUNITY)
Admission: EM | Admit: 2015-10-17 | Discharge: 2015-10-17 | Disposition: A | Discharge status: Home / Self Care | Payer: Self-pay | Attending: Emergency Medicine | Admitting: Emergency Medicine

## 2015-10-17 DIAGNOSIS — J45901 Unspecified asthma with (acute) exacerbation: Secondary | ICD-10-CM | POA: Insufficient documentation

## 2015-10-17 DIAGNOSIS — Z720 Tobacco use: Secondary | ICD-10-CM | POA: Insufficient documentation

## 2015-10-17 DIAGNOSIS — Z79899 Other long term (current) drug therapy: Secondary | ICD-10-CM | POA: Insufficient documentation

## 2015-10-17 MED ORDER — ALBUTEROL (5 MG/ML) CONTINUOUS INHALATION SOLN: 10.0000 mg/h | INHALATION_SOLUTION | Freq: Once | RESPIRATORY_TRACT | Status: DC

## 2015-10-17 MED ORDER — PREDNISONE 20 MG PO TABS: 40.0000 mg | ORAL_TABLET | ORAL | Status: DC

## 2015-10-17 MED ORDER — PREDNISONE 20 MG PO TABS
10.0000 mg | ORAL_TABLET | Freq: Every day | ORAL | Status: DC
  Administered 2015-10-17: 10 mg via ORAL
  Filled 2015-10-17: qty 1

## 2015-10-17 MED ORDER — ALBUTEROL SULFATE HFA 108 (90 BASE) MCG/ACT IN AERS
2.0000 | INHALATION_SPRAY | RESPIRATORY_TRACT | Status: DC | PRN
  Administered 2015-10-17: 2 via RESPIRATORY_TRACT
  Filled 2015-10-17: qty 6.7

## 2015-10-17 MED ORDER — ALBUTEROL (5 MG/ML) CONTINUOUS INHALATION SOLN
15.0000 mg/h | INHALATION_SOLUTION | Freq: Once | RESPIRATORY_TRACT | Status: AC
  Administered 2015-10-17: 15 mg/h via RESPIRATORY_TRACT
  Filled 2015-10-17: qty 20

## 2015-10-17 NOTE — ED Notes (Addendum)
Per EMS- 20-30 mins prior to EMS arrival pt paid down for a nap and woke up with SOB. Babysat niece on Wednesday -niece had a cold. Says her sore throat and congestion started after this. Attempted using Nyquil/cold/congestion medications without alleviation of symptoms. Took 2 puffs of Albuterol. Able to speak in complete sentences, but was in tripod position upon arrival. Has had 125 mg Solu-Medrol IV. Had 10 mg Albuterol and 2.5 Atrovent. C/o chest tightness centrally-tender to touch. Worsens with increased work of breathing. Has 18 G PIV in LAC. 12 lead unremarkable. HR 120s. Wheezing auscultated throughout per EMS. Denies cardiac issues.   Addendum-pt says she has chest pressure/tightness even when she is not having asthma exacerbations. Denies bloody sputum. No hx PE. Not on blood thinners. No other c/c. Is still current some day smoker.

## 2015-10-17 NOTE — Discharge Instructions (Signed)
Asthma, Adult Using her albuterol inhaler 2 puffs every 4 hours or your nebulizer every 4 hours as needed for shortness of breath. Return if needed more than every 4 hours. Start taking the prednisone prescribed tomorrow at 6 PM. Call any of the numbers on the resource guide for the [facility] and community wellness Center to get a primary care physician. Ask your new doctor to help you to stop smoking. Asthma is a recurring condition in which the airways tighten and narrow. Asthma can make it difficult to breathe. It can cause coughing, wheezing, and shortness of breath. Asthma episodes, also called asthma attacks, range from minor to life-threatening. Asthma cannot be cured, but medicines and lifestyle changes can help control it. CAUSES Asthma is believed to be caused by inherited (genetic) and environmental factors, but its exact cause is unknown. Asthma may be triggered by allergens, lung infections, or irritants in the air. Asthma triggers are different for each person. Common triggers include:   Animal dander.  Dust mites.  Cockroaches.  Pollen from trees or grass.  Mold.  Smoke.  Air pollutants such as dust, household cleaners, hair sprays, aerosol sprays, paint fumes, strong chemicals, or strong odors.  Cold air, weather changes, and winds (which increase molds and pollens in the air).  Strong emotional expressions such as crying or laughing hard.  Stress.  Certain medicines (such as aspirin) or types of drugs (such as beta-blockers).  Sulfites in foods and drinks. Foods and drinks that may contain sulfites include dried fruit, potato chips, and sparkling grape juice.  Infections or inflammatory conditions such as the flu, a cold, or an inflammation of the nasal membranes (rhinitis).  Gastroesophageal reflux disease (GERD).  Exercise or strenuous activity. SYMPTOMS Symptoms may occur immediately after asthma is triggered or many hours later. Symptoms  include:  Wheezing.  Excessive nighttime or early morning coughing.  Frequent or severe coughing with a common cold.  Chest tightness.  Shortness of breath. DIAGNOSIS  The diagnosis of asthma is made by a review of your medical history and a physical exam. Tests may also be performed. These may include:  Lung function studies. These tests show how much air you breathe in and out.  Allergy tests.  Imaging tests such as X-rays. TREATMENT  Asthma cannot be cured, but it can usually be controlled. Treatment involves identifying and avoiding your asthma triggers. It also involves medicines. There are 2 classes of medicine used for asthma treatment:   Controller medicines. These prevent asthma symptoms from occurring. They are usually taken every day.  Reliever or rescue medicines. These quickly relieve asthma symptoms. They are used as needed and provide short-term relief. Your health care provider will help you create an asthma action plan. An asthma action plan is a written plan for managing and treating your asthma attacks. It includes a list of your asthma triggers and how they may be avoided. It also includes information on when medicines should be taken and when their dosage should be changed. An action plan may also involve the use of a device called a peak flow meter. A peak flow meter measures how well the lungs are working. It helps you monitor your condition. HOME CARE INSTRUCTIONS   Take medicines only as directed by your health care provider. Speak with your health care provider if you have questions about how or when to take the medicines.  Use a peak flow meter as directed by your health care provider. Record and keep track of readings.  Understand and use the action plan to help minimize or stop an asthma attack without needing to seek medical care.  Control your home environment in the following ways to help prevent asthma attacks:  Do not smoke. Avoid being exposed to  secondhand smoke.  Change your heating and air conditioning filter regularly.  Limit your use of fireplaces and wood stoves.  Get rid of pests (such as roaches and mice) and their droppings.  Throw away plants if you see mold on them.  Clean your floors and dust regularly. Use unscented cleaning products.  Try to have someone else vacuum for you regularly. Stay out of rooms while they are being vacuumed and for a short while afterward. If you vacuum, use a dust mask from a hardware store, a double-layered or microfilter vacuum cleaner bag, or a vacuum cleaner with a HEPA filter.  Replace carpet with wood, tile, or vinyl flooring. Carpet can trap dander and dust.  Use allergy-proof pillows, mattress covers, and box spring covers.  Wash bed sheets and blankets every week in hot water and dry them in a dryer.  Use blankets that are made of polyester or cotton.  Clean bathrooms and kitchens with bleach. If possible, have someone repaint the walls in these rooms with mold-resistant paint. Keep out of the rooms that are being cleaned and painted.  Wash hands frequently. SEEK MEDICAL CARE IF:   You have wheezing, shortness of breath, or a cough even if taking medicine to prevent attacks.  The colored mucus you cough up (sputum) is thicker than usual.  Your sputum changes from clear or white to yellow, green, gray, or bloody.  You have any problems that may be related to the medicines you are taking (such as a rash, itching, swelling, or trouble breathing).  You are using a reliever medicine more than 2-3 times per week.  Your peak flow is still at 50-79% of your personal best after following your action plan for 1 hour.  You have a fever. SEEK IMMEDIATE MEDICAL CARE IF:   You seem to be getting worse and are unresponsive to treatment during an asthma attack.  You are short of breath even at rest.  You get short of breath when doing very little physical activity.  You have  difficulty eating, drinking, or talking due to asthma symptoms.  You develop chest pain.  You develop a fast heartbeat.  You have a bluish color to your lips or fingernails.  You are light-headed, dizzy, or faint.  Your peak flow is less than 50% of your personal best.   This information is not intended to replace advice given to you by your health care provider. Make sure you discuss any questions you have with your health care provider.   Document Released: 12/13/2005 Document Revised: 09/03/2015 Document Reviewed: 07/12/2013 Elsevier Interactive Patient Education 2016 [employer].  Emergency Department Resource Guide 1) Find a Doctor and Pay Out of Pocket Although you won't have to find out who is covered by your insurance plan, it is a good idea to ask around and get recommendations. You will then need to call the office and see if the doctor you have chosen will accept you as a new patient and what types of options they offer for patients who are self-pay. Some doctors offer discounts or will set up payment plans for their patients who do not have insurance, but you will need to ask so you aren't surprised when you get to your appointment.  2) Contact Your Local Health Department Not all health departments have doctors that can see patients for sick visits, but many do, so it is worth a call to see if yours does. If you don't know where your local health department is, you can check in your phone book. The CDC also has a tool to help you locate your state's health department, and many state websites also have listings of all of their local health departments.  3) Find a Walk-in Clinic If your illness is not likely to be very severe or complicated, you may want to try a walk in clinic. These are popping up all over the country in pharmacies, drugstores, and shopping centers. They're usually staffed by nurse practitioners or physician assistants that have been trained to treat common  illnesses and complaints. They're usually fairly quick and inexpensive. However, if you have serious medical issues or chronic medical problems, these are probably not your best option.  No Primary Care Doctor: - Call Health Connect at  [phone] - they can help you locate a primary care doctor that  accepts your insurance, provides certain services, etc. - Physician Referral Service- [phone]  Chronic Pain Problems: Organization         Address  Phone   Notes  [facility]  [phone] Patients need to be referred by their primary care doctor.   Medication Assistance: Organization         Address  Phone   Notes  [facility] Medication [facility] [address] [place] [place], [place] [address] [phone] --Must be a resident of [facility] -- Must have NO insurance coverage whatsoever (no Medicaid/ Medicare, etc.) -- The pt. MUST have a primary care doctor that directs their care regularly and follows them in the community   [facility]ist  [phone]   [employer]  [phone]    Agencies that provide inexpensive medical care: Organization         Address  Phone   Notes  [facility]  [phone]   [facility]    [phone]   [facility] [address] [place], [place] [address] [phone]   [facility] [address] [place] [address] [place] [phone]   Planned Parenthood    [phone]   [facility]    [phone]   [facility]  [address], [place] Phone:  [phone], Fax:  [phone] Hours of Operation:  9 am - 6 pm, M-F.  Also accepts Medicaid/Medicare and self-pay.  [facility]  [address], [place] Phone: [phone], Fax: [phone]. Hours of Operation:  8:30 am - 5:30 pm, M-F.  Also accepts Medicaid and self-pay.  [facility] [place]  [address] [place] Phone: [phone]   [facility] [address] Natasha Bence [place], [place] [phone], Ext. [phone] Mondays & Thursdays: 7-9 AM.  First 15 patients are seen on a first come, first serve basis.    Medicaid-accepting [facility] Providers:  Organization         Address  Phone   Notes  [facility] [address], [place] [phone] Also accepts self-pay patients.  [facility] [address] Laurell Josephs [place], [place]  [phone]   [facility] [address]  [place], [place] [phone]   [facility] [address], [place] [phone]   Renaye Rakers [address], [place]   [phone] Only accepts [place] Access [place] patients after they have their name applied to their card.   Self-Pay (no insurance) in [facility]:  Organization         Address  Phone   Notes  Sickle Cell Patients, [facility] Internal Medicine [address] [place], [place] [phone]   [facility] [address] [place], [place] [phone]   [facility]  [address], [place] [phone]   [facility]/Dr. Osei-Bonsu  [address] [place] or [address], [place] [phone] Phone number for both [place] and [place] locations is the same.  [facility] [address], [place] [phone]   [facility] [address], [place] or [address] [phone]   [facility] [address], [place] [phone], phone; [phone], fax Sees patients 1st and 3rd Saturday of every month.  Must not qualify for public or private insurance (i.e. Medicaid, Medicare, [place] Health Choice, Veterans' Benefits)  Household income should be no more than 200% of the  poverty level The clinic cannot treat you if you are pregnant or think you are pregnant  Sexually transmitted diseases are not treated at the clinic.    Dental Care: Organization         Address  Phone  Notes  [facility] Department of [facility] [address] [place], [place] [phone] Accepts children up to age 29 who are enrolled in [place] or [place] Health Choice; pregnant women with a Medicaid card; and children who have applied for Medicaid or [place] Health Choice, but were declined, whose parents can pay a reduced fee at time of service.  [facility] Department of [facility]  [address], [place] [phone] Accepts children up to age 90 who are enrolled in [place] or [place] Health Choice; pregnant women with a Medicaid card; and children who have applied for Medicaid or [place] Health Choice, but were declined, whose parents can pay a reduced fee at time of service.  [facility]  [address] [place], [place] [phone] Patients are seen by appointment only. Walk-ins are not accepted. [facility] will see patients 65 years of age and older. Monday - Tuesday (8am-5pm) Most Wednesdays (8:30-5pm) $30 per visit, cash only  [facility] Adult Dental Access PRO[facility]  [address], [facility] [phone] Patients are seen by appointment only. Walk-ins are not accepted. [facility] will see patients 66 years of age and older. One Wednesday Evening (Monthly: Volunteer Based).  $30 per visit, cash only  [employer]  [phone] for adults; Children under age 78, call Graduate Pediatric Dentistry at [phone]. Children aged 38-14, please call [phone] to request a pediatric application.  Dental services are provided in all areas of dental care including fillings, crowns and bridges, complete and partial dentures, implants, gum treatment, root canals, and extractions.  Preventive care is also provided. Treatment is provided to both adults and children. Patients are selected via a lottery and there is often a waiting list.   [facility] [address],  [place]  [phone] [email or web address]   [facility] [address] [place], [place] [phone], Ext. [phone] Second and Fourth Thursday of each month, opens at 6:30 AM; Clinic ends at 9 AM.  Patients are seen on a first-come first-served basis, and a limited number are seen during each clinic.   [facility]  [address] Ether Griffins [place], [place] [phone]   Eligibility Requirements You must have lived in [place], [place], or [place] counties for at least the last three months.   You cannot be eligible for state or federal sponsored [place], including [employer], [place], or [employer].   You generally cannot be eligible for healthcare insurance through your employer.    How to apply: Eligibility screenings are held every Tuesday and Wednesday afternoon from 1:00 pm until 4:00 pm. You do not need an appointment for the interview!  [facility] [address], [place], [place] [phone]   [facility]  [phone]   [facility] Health Department  [phone]   [facility] Health Department  [phone]    Behavioral Health Resources in the Community: Intensive Outpatient Programs Organization         Address  Phone  Notes  [facility] [address], [place], [place] [phone]   [facility]patient [address], [place], [place] [phone]   [facility] [address] [place], [place]  [phone]   [facility] Mental Health [address]  [place], [place] [phone] or [phone]   Substance Abuse Resources Organization         Address  Phone  Notes  [facility] Services  [phone]   Addiction  [facility]  [phone]   [facility]  [phone]   Floydene Flock  [phone]   Residential & Outpatient Substance Abuse Program  [phone]   Psychological Services Organization         Address  Phone  Notes  [facility] Behavioral Health  [phone]   [facility]  [phone]   [facility] Mental Health [address], [place] [phone] or [phone]    Mobile Crisis Teams Organization         Address  Phone  Notes  [facility], Mobile Crisis Care Unit  [phone]   [facility]  [address] [place], [place] [phone]   Doristine Locks [address] [place] [phone]    Self-Help/Support Groups Organization         Address  Phone             Notes  [facility] [place] - variety of support groups  336- I7437963 Call for more information  Narcotics Anonymous (NA), [facility] [address], [employer] [place]  2 meetings at this location   Statistician         Address  Phone  Notes  [facility] [address],    [place]  [phone]   [facility]  [address], [place] [address], [place], [place] [phone]   [facility] [address] [place], [place] [phone] Admissions: 8am-3pm M-F  [facility] [address]    [place], [place] [phone]   [facility] [address] [place], [place], [place] [phone]   [facility] [address]  [place], [place] [phone]   [facility] - Intensive Outpatient [address] Laurell Josephs [address], [place], [place] [phone]   [facility] (Addiction [facility] [address] [place]  [place], [place] [phone] or [phone]   [facility] ([facility]) [address] [place], [place] [phone] Accepts Medicaid  [facility] [address],  [place]  [phone] Substance Abuse/Addiction Treatment   [facility] Organization         Address  Phone  Notes  [facility]  [phone]   Angie Fava, PhD [address] Ervin Knack [place], [place]   [phone] or 4081419002   [facility] Behavioral   [address] [place], [place] [phone]   [facility] [address], [place], [place] [phone] Insurance/Medicaid/sponsorship through [facility] and Families [address]                                    [place], [place] [phone] Therapy/tele-psych/case  [address] [facility] [address], [place] [phone]    Dr. Lolly Mustache  [phone]   [facility]  [employer] [facility] Dept. 1) [address], [place] 2) [address] [place] 3)  [address], [place] [phone]  [phone]   [facility] Child Abuse Hotline 872-597-6672 or [phone] (After Hours)

## 2015-10-17 NOTE — ED Provider Notes (Signed)
CSN: [id number]     Arrival date & time 10/17/15  1413 History   First MD Initiated Contact with Patient 10/17/15 1459     Chief Complaint  Patient presents with  . Shortness of Breath  . Asthma     (Consider location/radiation/quality/duration/timing/severity/associated sxs/prior Treatment) HPI Complains of wheezing, typical of asthma onset 11 AM today. Patient also reports that she's had "cold symptoms" with nasal congestion cough and sneezing onset 2 days ago. She is treated herself with DayQuil, and her albuterol nebulizer with partial relief. She called EMS this morning EMS treated her with albuterol-ipratropium nebulizer and Solu-Medrol intravenously with partial relief she states her breathing is much improved presently. Past Medical History  Diagnosis Date  . Asthma    3 emergency department visits the past year for asthma. Positive history of steroids. No history of intubation Past Surgical History  Procedure Laterality Date  . Breast surgery     Family History  Problem Relation Age of Onset  . Emphysema Maternal Grandfather   . Heart disease Maternal Grandmother   . Diabetes Maternal Grandmother   . Kidney failure Maternal Grandmother    Social History  Substance Use Topics  . Smoking status: Current Some Day Smoker -- 0.30 packs/day for 6 years    Types: Cigarettes  . Smokeless tobacco: None     Comment: pt states she smokes a signal cig occasionally a month  . Alcohol Use: Yes     Comment: occasional drinker   no illicit drug use OB History    No data available     Review of Systems  Constitutional: Negative.   HENT: Positive for congestion and sneezing.   Respiratory: Positive for cough, shortness of breath and wheezing.   Cardiovascular: Negative.   Gastrointestinal: Negative.   Musculoskeletal: Negative.   Skin: Negative.   Neurological: Negative.   Psychiatric/Behavioral: Negative.   All other systems reviewed and are negative.     Allergies   Review of patient's allergies indicates no known allergies.  Home Medications   Prior to Admission medications   Medication Sig Start Date End Date Taking? Authorizing Provider  albuterol (PROVENTIL HFA;VENTOLIN HFA) 108 (90 BASE) MCG/ACT inhaler Inhale 1-2 puffs into the lungs every 6 (six) hours as needed for wheezing. 08/28/15  Yes Rolland Porter, MD  albuterol (PROVENTIL) (2.5 MG/3ML) 0.083% nebulizer solution Take 3 mLs (2.5 mg total) by nebulization every 6 (six) hours as needed for wheezing or shortness of breath. 08/28/15  Yes Rolland Porter, MD  cetirizine (ZYRTEC) 10 MG tablet Take 10 mg by mouth daily as needed for allergies.   Yes Historical Provider, MD  loratadine (CLARITIN) 10 MG tablet Take 10 mg by mouth daily as needed for allergies.   Yes Historical Provider, MD  Pseudoephedrine-APAP-DM (DAYQUIL PO) Take 2 tablets by mouth every 8 (eight) hours as needed (cold symptoms).   Yes Historical Provider, MD  Budesonide (PULMICORT FLEXHALER) 90 MCG/ACT inhaler Inhale 2 puffs into the lungs 2 (two) times daily. Patient not taking: Reported on 10/17/2015 08/28/15   Rolland Porter, MD  predniSONE (DELTASONE) 20 MG tablet Take 1 tablet (20 mg total) by mouth 2 (two) times daily with a meal. Patient not taking: Reported on 10/17/2015 08/28/15   Rolland Porter, MD   BP 124/59 mmHg  Pulse 124  Temp(Src) 97.4 F (36.3 C) (Oral)  Resp 32  SpO2 100%  LMP 09/23/2015 (Approximate) Physical Exam  Constitutional: She appears well-developed and well-nourished. No distress.  HENT:  Head: Normocephalic and  atraumatic.  Eyes: Conjunctivae are normal. Pupils are equal, round, and reactive to light.  Neck: Neck supple. No tracheal deviation present. No thyromegaly present.  Cardiovascular: Normal rate and regular rhythm.   No murmur heard. Pulmonary/Chest: Effort normal and breath sounds normal. No respiratory distress. She has no wheezes.  Prolonged expiratory phase with expiratory wheezes  Abdominal: Soft. Bowel  sounds are normal. She exhibits no distension. There is no tenderness.  Musculoskeletal: Normal range of motion. She exhibits no edema or tenderness.  Neurological: She is alert. Coordination normal.  Skin: Skin is warm and dry. No rash noted.  Psychiatric: She has a normal mood and affect.  Nursing note and vitals reviewed.   ED Course  Procedures (including critical care time) Labs Review Labs Reviewed - No data to display  Imaging Review No results found. I have personally reviewed and evaluated these images and lab results as part of my medical decision-making.   EKG Interpretation   Date/Time:  Friday October 17 2015 14:23:53 EDT Ventricular Rate:  119 PR Interval:  122 QRS Duration: 91 QT Interval:  336 QTC Calculation: 473 R Axis:   46 Text Interpretation:  Sinus tachycardia Multiform ventricular premature  complexes Repol abnrm suggests ischemia, inferior leads Baseline wander in  lead(s) V6 SINCE LAST TRACING HEART RATE HAS INCREASED Confirmed by  Ethelda ChickJACUBOWITZ  MD, Sixto Bowdish [phone] on 10/17/2015 3:02:54 PM      After 1 hour continuous nebulization patient states she is breathing almost at baseline. She feels ready to go home. She does not feel dyspneic. Lungs clear to auscultation. She is in no respiratory distress MDM  Plan spent 5 minutes counseling patient on smoking cessation. Albuterol HFA to go to use 2 puffs every 4 hours when necessary shortness of breath return if needed more than every 4 hours. Patient has spacer at home. She also has albuterol nebulizer at home. Prescription prednisone. [facility] and recent [place] a primary care physician Diagnosis #1 exacerbation of asthma #2 tobacco abuse Final diagnoses:  None         Doug SouSam Annamary Buschman, MD 10/18/15 0020

## 2015-10-17 NOTE — ED Notes (Signed)
Bed: [id number] Expected date:  Expected time:  Means of arrival:  Comments: EMS- 38yo F, SOB/chest tightness

## 2015-10-17 NOTE — ED Notes (Signed)
Pt able to ambulate while maintaining saturation of 95% and greater. No increased WOB noted. MD Jacubowitz aware.

## 2015-11-12 ENCOUNTER — Encounter (HOSPITAL_COMMUNITY): Payer: Self-pay | Admitting: Emergency Medicine

## 2015-11-12 ENCOUNTER — Emergency Department (HOSPITAL_COMMUNITY)
Admission: EM | Admit: 2015-11-12 | Discharge: 2015-11-12 | Disposition: A | Discharge status: Home / Self Care | Payer: Self-pay | Attending: Emergency Medicine | Admitting: Emergency Medicine

## 2015-11-12 DIAGNOSIS — F17210 Nicotine dependence, cigarettes, uncomplicated: Secondary | ICD-10-CM | POA: Insufficient documentation

## 2015-11-12 DIAGNOSIS — Z79899 Other long term (current) drug therapy: Secondary | ICD-10-CM | POA: Insufficient documentation

## 2015-11-12 DIAGNOSIS — J4541 Moderate persistent asthma with (acute) exacerbation: Secondary | ICD-10-CM | POA: Insufficient documentation

## 2015-11-12 MED ORDER — IPRATROPIUM-ALBUTEROL 0.5-2.5 (3) MG/3ML IN SOLN: 3.0000 mL | RESPIRATORY_TRACT | Status: DC | PRN

## 2015-11-12 MED ORDER — IPRATROPIUM-ALBUTEROL 0.5-2.5 (3) MG/3ML IN SOLN
3.0000 mL | Freq: Once | RESPIRATORY_TRACT | Status: AC
  Administered 2015-11-12: 3 mL via RESPIRATORY_TRACT

## 2015-11-12 MED ORDER — PREDNISONE 20 MG PO TABS: 60.0000 mg | ORAL_TABLET | Freq: Every day | ORAL | Status: DC

## 2015-11-12 NOTE — Discharge Instructions (Signed)
Asthma, Adult Asthma is a recurring condition in which the airways tighten and narrow. Asthma can make it difficult to breathe. It can cause coughing, wheezing, and shortness of breath. Asthma episodes, also called asthma attacks, range from minor to life-threatening. Asthma cannot be cured, but medicines and lifestyle changes can help control it. CAUSES Asthma is believed to be caused by inherited (genetic) and environmental factors, but its exact cause is unknown. Asthma may be triggered by allergens, lung infections, or irritants in the air. Asthma triggers are different for each person. Common triggers include:   Animal dander.  Dust mites.  Cockroaches.  Pollen from trees or grass.  Mold.  Smoke.  Air pollutants such as dust, household cleaners, hair sprays, aerosol sprays, paint fumes, strong chemicals, or strong odors.  Cold air, weather changes, and winds (which increase molds and pollens in the air).  Strong emotional expressions such as crying or laughing hard.  Stress.  Certain medicines (such as aspirin) or types of drugs (such as beta-blockers).  Sulfites in foods and drinks. Foods and drinks that may contain sulfites include dried fruit, potato chips, and sparkling grape juice.  Infections or inflammatory conditions such as the flu, a cold, or an inflammation of the nasal membranes (rhinitis).  Gastroesophageal reflux disease (GERD).  Exercise or strenuous activity. SYMPTOMS Symptoms may occur immediately after asthma is triggered or many hours later. Symptoms include:  Wheezing.  Excessive nighttime or early morning coughing.  Frequent or severe coughing with a common cold.  Chest tightness.  Shortness of breath. DIAGNOSIS  The diagnosis of asthma is made by a review of your medical history and a physical exam. Tests may also be performed. These may include:  Lung function studies. These tests show how much air you breathe in and out.  Allergy  tests.  Imaging tests such as X-rays. TREATMENT  Asthma cannot be cured, but it can usually be controlled. Treatment involves identifying and avoiding your asthma triggers. It also involves medicines. There are 2 classes of medicine used for asthma treatment:   Controller medicines. These prevent asthma symptoms from occurring. They are usually taken every day.  Reliever or rescue medicines. These quickly relieve asthma symptoms. They are used as needed and provide short-term relief. Your health care provider will help you create an asthma action plan. An asthma action plan is a written plan for managing and treating your asthma attacks. It includes a list of your asthma triggers and how they may be avoided. It also includes information on when medicines should be taken and when their dosage should be changed. An action plan may also involve the use of a device called a peak flow meter. A peak flow meter measures how well the lungs are working. It helps you monitor your condition. HOME CARE INSTRUCTIONS   Take medicines only as directed by your health care provider. Speak with your health care provider if you have questions about how or when to take the medicines.  Use a peak flow meter as directed by your health care provider. Record and keep track of readings.  Understand and use the action plan to help minimize or stop an asthma attack without needing to seek medical care.  Control your home environment in the following ways to help prevent asthma attacks:  Do not smoke. Avoid being exposed to secondhand smoke.  Change your heating and air conditioning filter regularly.  Limit your use of fireplaces and wood stoves.  Get rid of pests (such as roaches   and mice) and their droppings.  Throw away plants if you see mold on them.  Clean your floors and dust regularly. Use unscented cleaning products.  Try to have someone else vacuum for you regularly. Stay out of rooms while they are  being vacuumed and for a short while afterward. If you vacuum, use a dust mask from a hardware store, a double-layered or microfilter vacuum cleaner bag, or a vacuum cleaner with a HEPA filter.  Replace carpet with wood, tile, or vinyl flooring. Carpet can trap dander and dust.  Use allergy-proof pillows, mattress covers, and box spring covers.  Wash bed sheets and blankets every week in hot water and dry them in a dryer.  Use blankets that are made of polyester or cotton.  Clean bathrooms and kitchens with bleach. If possible, have someone repaint the walls in these rooms with mold-resistant paint. Keep out of the rooms that are being cleaned and painted.  Wash hands frequently. SEEK MEDICAL CARE IF:   You have wheezing, shortness of breath, or a cough even if taking medicine to prevent attacks.  The colored mucus you cough up (sputum) is thicker than usual.  Your sputum changes from clear or white to yellow, green, gray, or bloody.  You have any problems that may be related to the medicines you are taking (such as a rash, itching, swelling, or trouble breathing).  You are using a reliever medicine more than 2-3 times per week.  Your peak flow is still at 50-79% of your personal best after following your action plan for 1 hour.  You have a fever. SEEK IMMEDIATE MEDICAL CARE IF:   You seem to be getting worse and are unresponsive to treatment during an asthma attack.  You are short of breath even at rest.  You get short of breath when doing very little physical activity.  You have difficulty eating, drinking, or talking due to asthma symptoms.  You develop chest pain.  You develop a fast heartbeat.  You have a bluish color to your lips or fingernails.  You are light-headed, dizzy, or faint.  Your peak flow is less than 50% of your personal best.   This information is not intended to replace advice given to you by your health care provider. Make sure you discuss any  questions you have with your health care provider.   Document Released: 12/13/2005 Document Revised: 09/03/2015 Document Reviewed: 07/12/2013 Elsevier Interactive Patient Education 2016 [employer]  

## 2015-11-12 NOTE — ED Provider Notes (Signed)
CSN: [id number]     Arrival date & time 11/12/15  0612 History   First MD Initiated Contact with Patient 11/12/15 [phone]     Chief Complaint  Patient presents with  . Shortness of Breath     (Consider location/radiation/quality/duration/timing/severity/associated sxs/prior Treatment) HPI Comments: Presents to the emergency department for evaluation of shortness of breath. Patient reports waking from sleep feeling severely short of breath. Patient does have a history of asthma. She is brought to the emergency department by EMS. Patient has been administered Solu-Medrol 125 mg IV, albuterol 10 mg and Atrovent 1 mg by nebulizer. Patient has had improvement with treatment. She reports feeling very anxious and still feeling tight in her chest with her breathing. She has not had any fever or cough.  Patient is a 39 y.o. female presenting with shortness of breath.  Shortness of Breath   Past Medical History  Diagnosis Date  . Asthma    Past Surgical History  Procedure Laterality Date  . Breast surgery     Family History  Problem Relation Age of Onset  . Emphysema Maternal Grandfather   . Heart disease Maternal Grandmother   . Diabetes Maternal Grandmother   . Kidney failure Maternal Grandmother    Social History  Substance Use Topics  . Smoking status: Current Some Day Smoker -- 0.30 packs/day for 6 years    Types: Cigarettes  . Smokeless tobacco: None     Comment: pt states she smokes a signal cig occasionally a month  . Alcohol Use: Yes     Comment: occasional drinker   OB History    No data available     Review of Systems  Respiratory: Positive for shortness of breath.   All other systems reviewed and are negative.     Allergies  Review of patient's allergies indicates no known allergies.  Home Medications   Prior to Admission medications   Medication Sig Start Date End Date Taking? Authorizing Provider  albuterol (PROVENTIL HFA;VENTOLIN HFA) 108 (90 BASE) MCG/ACT  inhaler Inhale 1-2 puffs into the lungs every 6 (six) hours as needed for wheezing. 08/28/15  Yes Rolland Porter, MD  albuterol (PROVENTIL) (2.5 MG/3ML) 0.083% nebulizer solution Take 3 mLs (2.5 mg total) by nebulization every 6 (six) hours as needed for wheezing or shortness of breath. 08/28/15  Yes Rolland Porter, MD  cetirizine (ZYRTEC) 10 MG tablet Take 10 mg by mouth daily as needed for allergies.   Yes Historical Provider, MD  loratadine (CLARITIN) 10 MG tablet Take 10 mg by mouth daily as needed for allergies.   Yes Historical Provider, MD  pseudoephedrine (SUDAFED) 60 MG tablet Take 60 mg by mouth every 4 (four) hours as needed for congestion.   Yes Historical Provider, MD  Pseudoephedrine-APAP-DM (DAYQUIL PO) Take 2 tablets by mouth every 8 (eight) hours as needed (cold symptoms).   Yes Historical Provider, MD  Budesonide (PULMICORT FLEXHALER) 90 MCG/ACT inhaler Inhale 2 puffs into the lungs 2 (two) times daily. Patient not taking: Reported on 10/17/2015 08/28/15   Rolland Porter, MD  predniSONE (DELTASONE) 20 MG tablet Take 2 tablets (40 mg total) by mouth 1 day or 1 dose. Take 2 tablets each day at 6 PM starting 10/18/2015 Patient not taking: Reported on 11/12/2015 10/17/15   Doug Sou, MD   BP 105/82 mmHg  Pulse 119  Temp(Src) 97.4 F (36.3 C) (Oral)  Resp 20  SpO2 99%  LMP 09/23/2015 (Approximate) Physical Exam  Constitutional: She is oriented to person, place, and  time. She appears well-developed and well-nourished. No distress.  HENT:  Head: Normocephalic and atraumatic.  Right Ear: Hearing normal.  Left Ear: Hearing normal.  Nose: Nose normal.  Mouth/Throat: Oropharynx is clear and moist and mucous membranes are normal.  Eyes: Conjunctivae and EOM are normal. Pupils are equal, round, and reactive to light.  Neck: Normal range of motion. Neck supple.  Cardiovascular: Regular rhythm, S1 normal and S2 normal.  Exam reveals no gallop and no friction rub.   No murmur  heard. Pulmonary/Chest: Effort normal. No respiratory distress. She has no decreased breath sounds. She has wheezes. She exhibits no tenderness.  Abdominal: Soft. Normal appearance and bowel sounds are normal. There is no hepatosplenomegaly. There is no tenderness. There is no rebound, no guarding, no tenderness at McBurney's point and negative Murphy's sign. No hernia.  Musculoskeletal: Normal range of motion.  Neurological: She is alert and oriented to person, place, and time. She has normal strength. No cranial nerve deficit or sensory deficit. Coordination normal. GCS eye subscore is 4. GCS verbal subscore is 5. GCS motor subscore is 6.  Skin: Skin is warm, dry and intact. No rash noted. No cyanosis.  Psychiatric: She has a normal mood and affect. Her speech is normal and behavior is normal. Thought content normal.  Nursing note and vitals reviewed.   ED Course  Procedures (including critical care time) Labs Review Labs Reviewed - No data to display  Imaging Review No results found. I have personally reviewed and evaluated these images and lab results as part of my medical decision-making.   EKG Interpretation   Date/Time:  Wednesday November 12 2015 06:32:53 EST Ventricular Rate:  108 PR Interval:  137 QRS Duration: 71 QT Interval:  342 QTC Calculation: 458 R Axis:   43 Text Interpretation:  Sinus tachycardia Baseline wander in lead(s) V4 No  significant change since last tracing Confirmed by POLLINA  MD,  CHRISTOPHER [phone] on 11/12/2015 6:40:05 AM      MDM   Final diagnoses:  None   acute bronchospasm  Asthma  Anxiety  Presents to the emergency department for evaluation of difficulty breathing. Patient has a history of asthma. Patient reports acute onset of wheezing and shortness of breath this morning. She was treated aggressively with above-mentioned medications by EMS and has improved. Examination revealed minimal wheezing at arrival, good air movement. She has  oxygenated well.  Patient tells me that she feels that she is not being treated appropriately because she does not have insurance. She reports that "something is different". Patient is extremely tearful during the evaluation. She appears to be experiencing severe anxiety and possibly panic attack in addition to her asthma exacerbation. I did counsel her that her presentation is consistent with asthma and does not require any elaborate testing. She does report a previous history of being treated by Dr. Marchelle Gearingamaswamy, pulmonology. I recommend that she follows up with him in the office.    Gilda Creasehristopher J Pollina, MD 11/12/15 [phone]

## 2015-11-12 NOTE — ED Notes (Signed)
Pt arrived to the ED with shortness of breath since 0400 today.  Pt was given 10 of albuterol and 1 of atrovent in route.  Pt also given 125 mg of solu medrol.

## 2015-11-12 NOTE — ED Notes (Signed)
Bed: WA07 Expected date:  Expected time:  Means of arrival:  Comments: EMS shortness of breath - getting nebs

## 2015-12-19 IMAGING — CR DG CHEST 2V
2 series · 2 of 2 positions shown · non-contrast
Comparison: 12/31/2013.

CLINICAL DATA: Cough, nasal congestion for 2 days

EXAM:
CHEST  2 VIEW

[w chest pa]
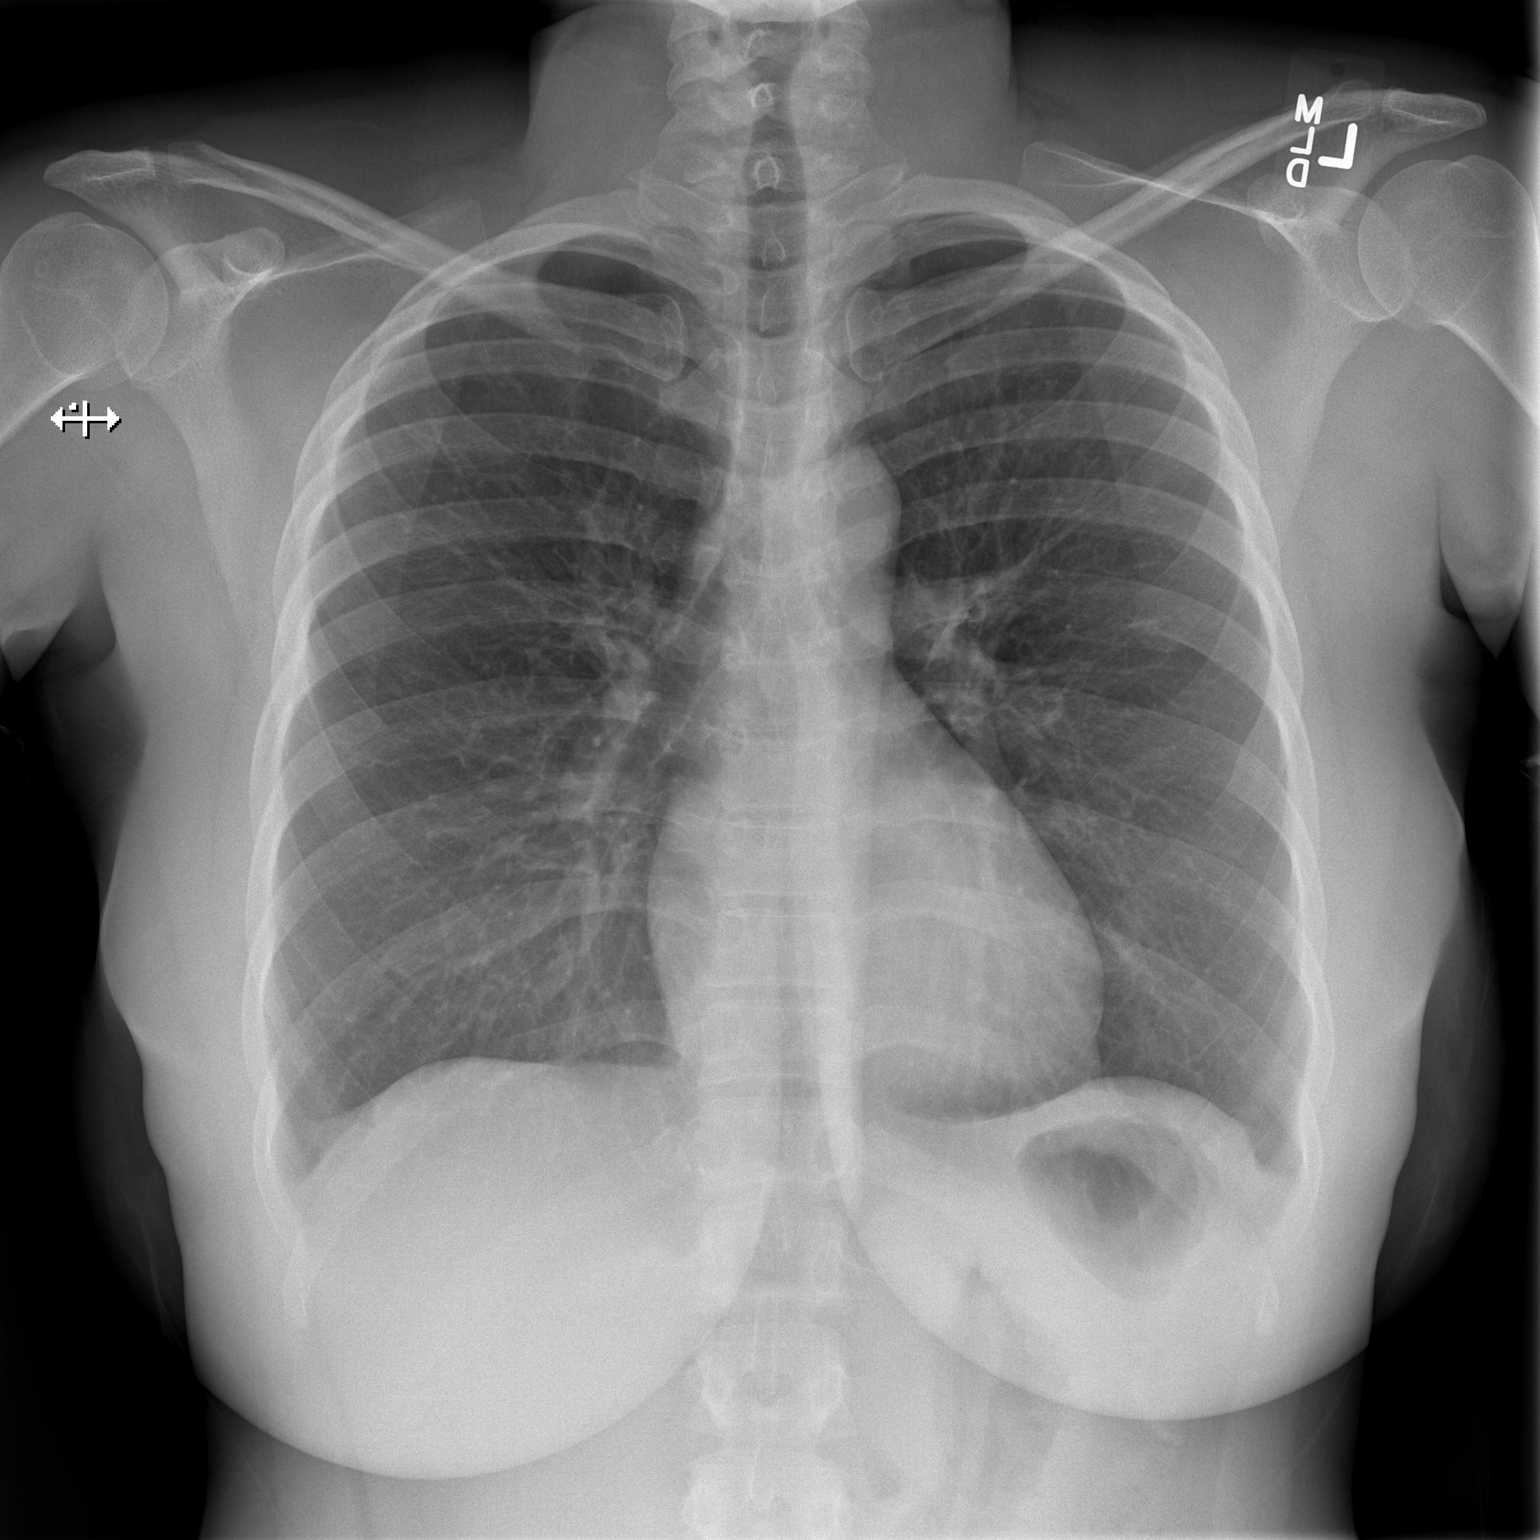

[w chest lat]
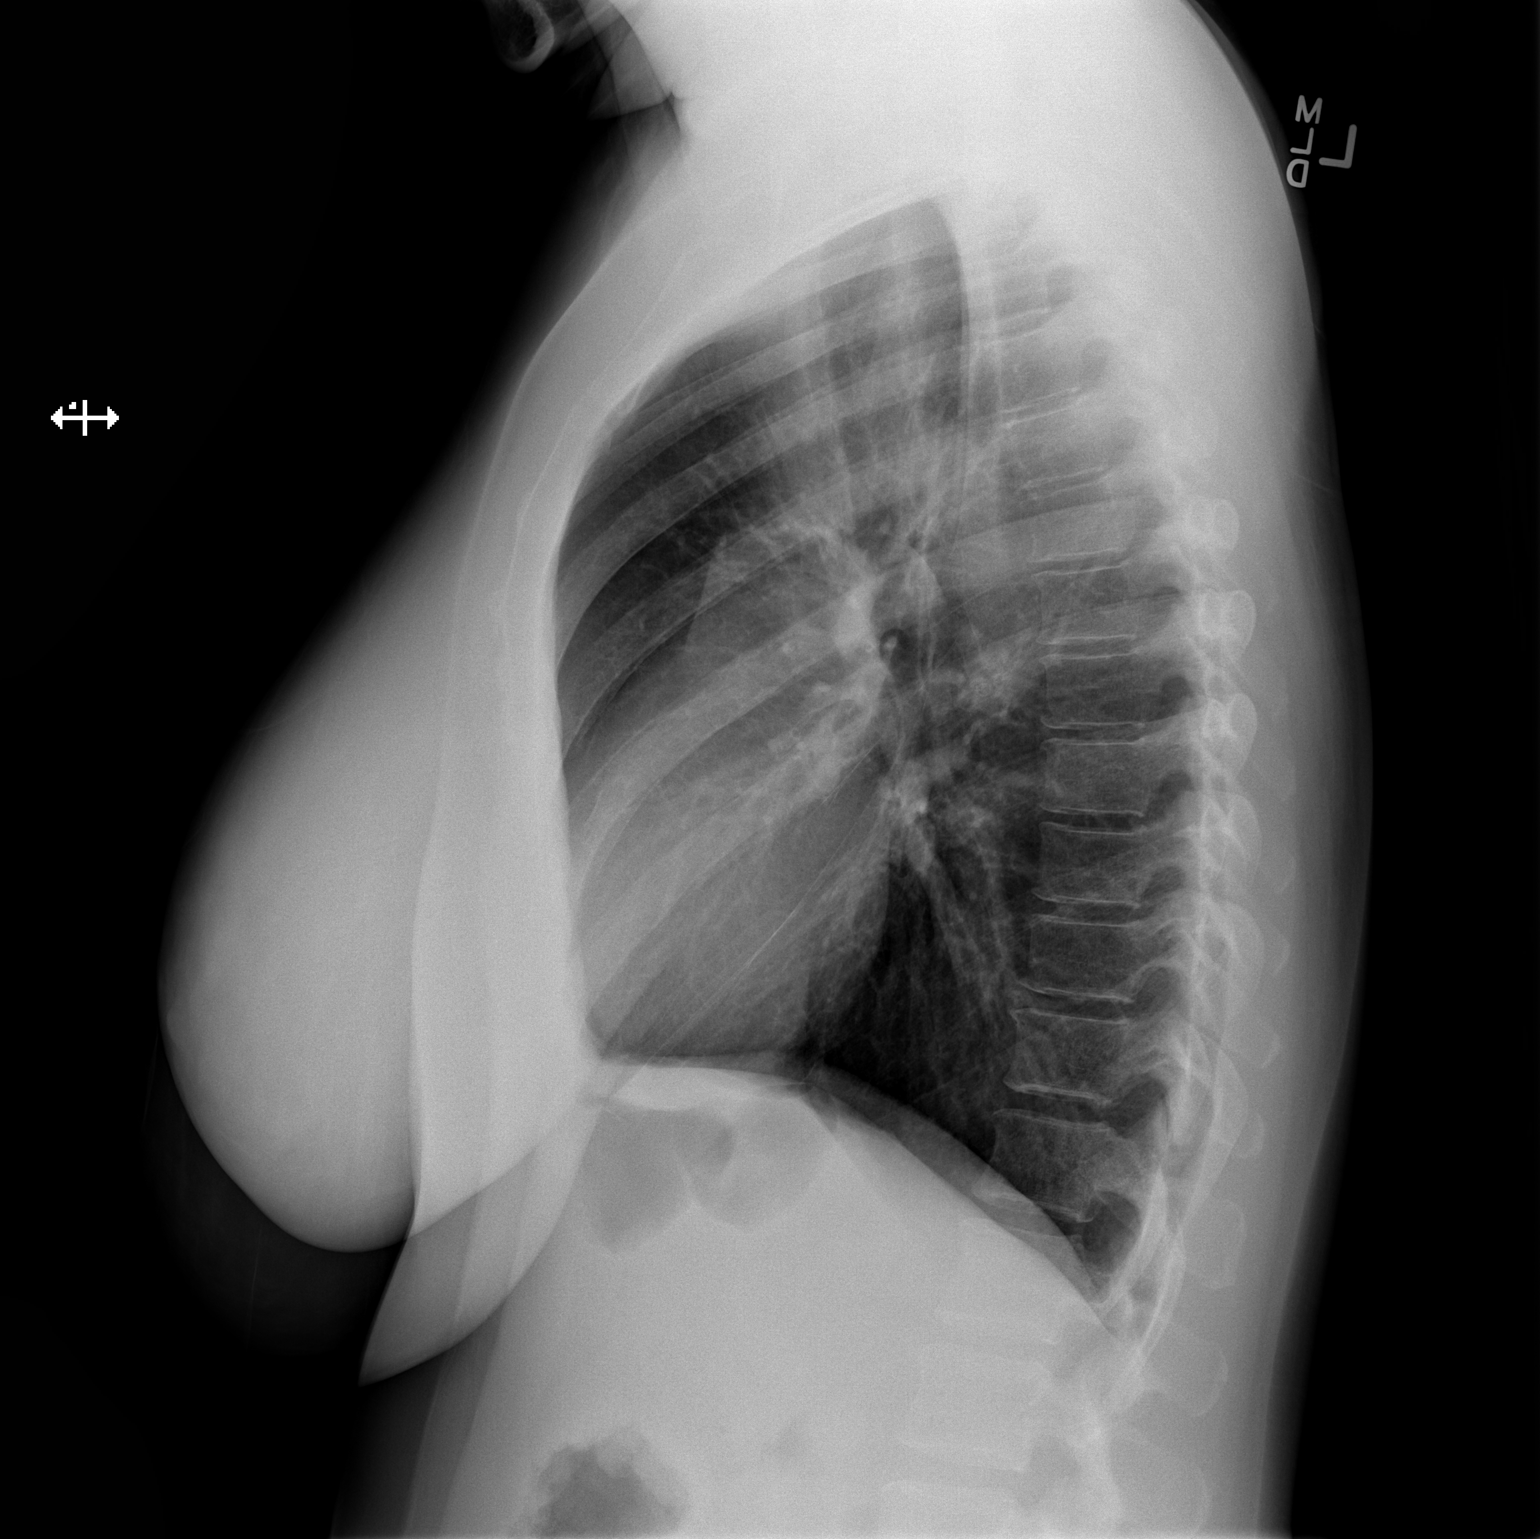

[2 of 2 positions shown; findings below may reference images not displayed]

FINDINGS: The heart size and mediastinal contours are within normal limits.
Both lungs are clear. The visualized skeletal structures are
unremarkable.
IMPRESSION: No active cardiopulmonary disease.

## 2015-12-21 IMAGING — CR DG CHEST 2V
2 series · 2 of 2 positions shown · non-contrast
Comparison: 11/09/2014

CLINICAL DATA: Shortness of breath, asthma

EXAM:
CHEST  2 VIEW

[w chest pa]
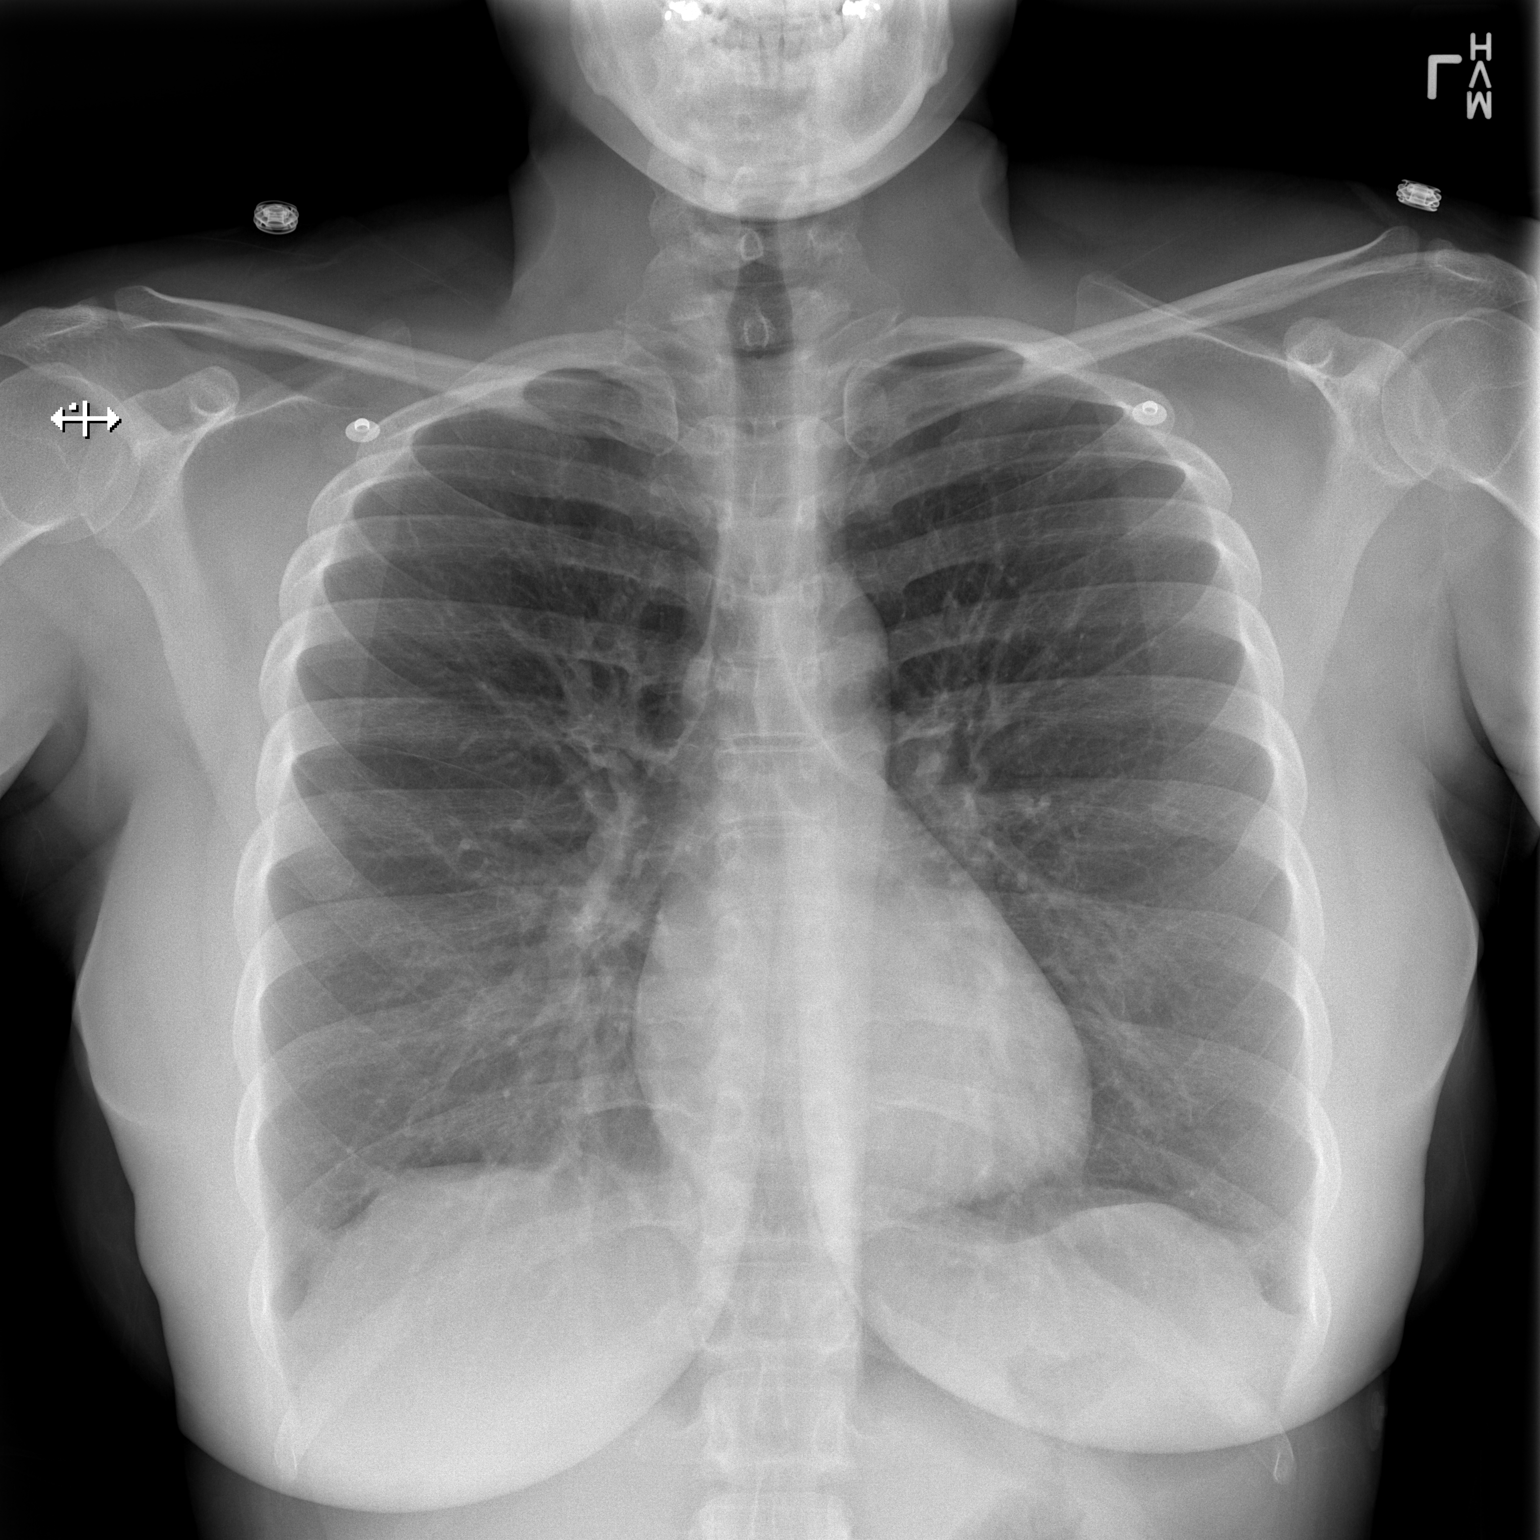

[w chest lat]
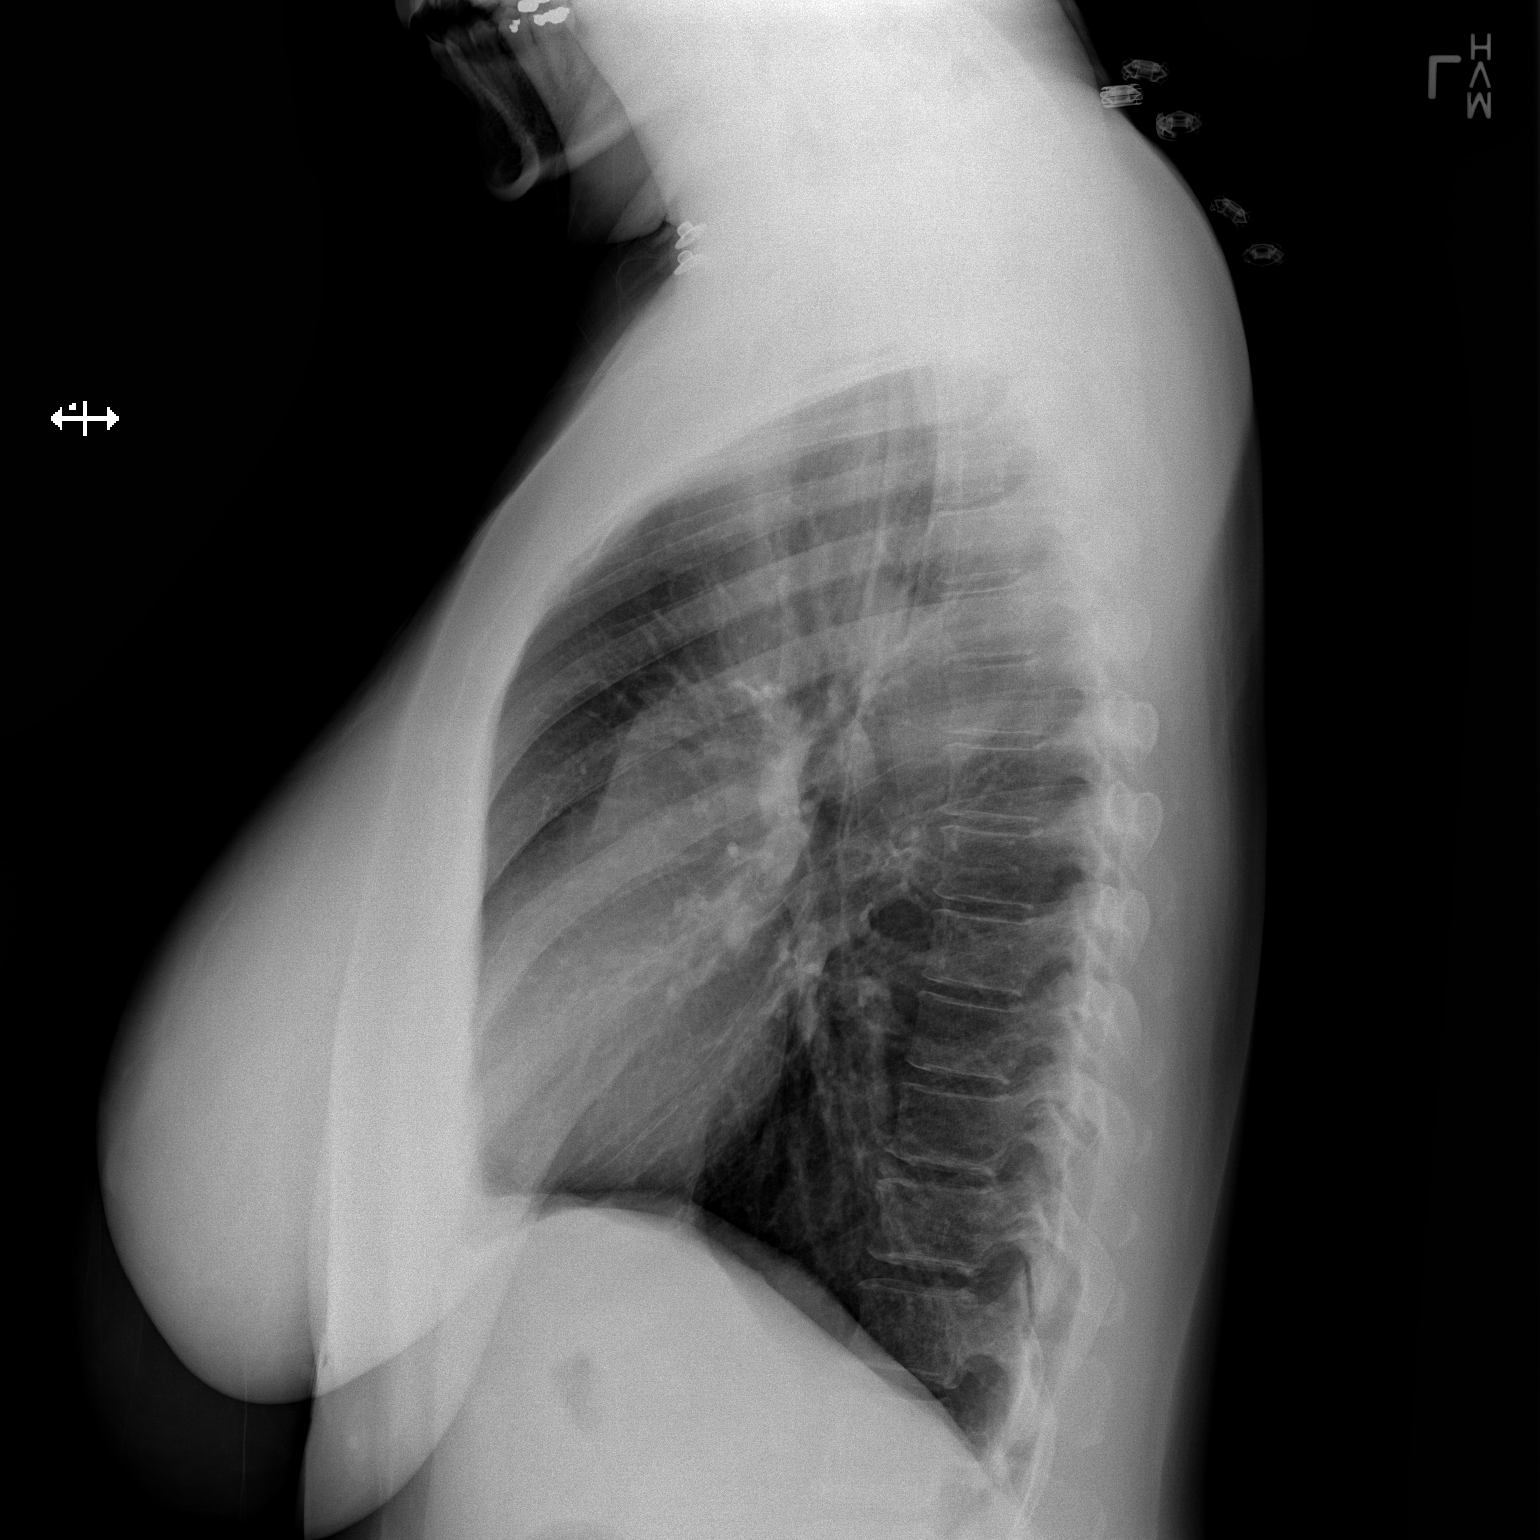

[2 of 2 positions shown; findings below may reference images not displayed]

FINDINGS: There is no focal parenchymal opacity, pleural effusion, or
pneumothorax. The heart and mediastinal contours are unremarkable.

The osseous structures are unremarkable.
IMPRESSION: No active cardiopulmonary disease.

## 2018-01-16 ENCOUNTER — Emergency Department (HOSPITAL_COMMUNITY)
Admission: EM | Admit: 2018-01-16 | Discharge: 2018-01-16 | Disposition: A | Discharge status: Home / Self Care | Payer: Self-pay | Attending: Emergency Medicine | Admitting: Emergency Medicine

## 2018-01-16 ENCOUNTER — Encounter (HOSPITAL_COMMUNITY): Payer: Self-pay | Admitting: Emergency Medicine

## 2018-01-16 DIAGNOSIS — Z79899 Other long term (current) drug therapy: Secondary | ICD-10-CM | POA: Insufficient documentation

## 2018-01-16 DIAGNOSIS — J4540 Moderate persistent asthma, uncomplicated: Secondary | ICD-10-CM | POA: Insufficient documentation

## 2018-01-16 DIAGNOSIS — K047 Periapical abscess without sinus: Secondary | ICD-10-CM | POA: Insufficient documentation

## 2018-01-16 DIAGNOSIS — F17210 Nicotine dependence, cigarettes, uncomplicated: Secondary | ICD-10-CM | POA: Insufficient documentation

## 2018-01-16 MED ORDER — LIDOCAINE HCL (PF) 1 % IJ SOLN
5.0000 mL | Freq: Once | INTRAMUSCULAR | Status: AC
  Administered 2018-01-16: 5 mL
  Filled 2018-01-16: qty 5

## 2018-01-16 MED ORDER — CLINDAMYCIN HCL 300 MG PO CAPS
450.0000 mg | ORAL_CAPSULE | Freq: Once | ORAL | Status: AC
  Administered 2018-01-16: 450 mg via ORAL
  Filled 2018-01-16: qty 1

## 2018-01-16 MED ORDER — TETANUS-DIPHTH-ACELL PERTUSSIS 5-2.5-18.5 LF-MCG/0.5 IM SUSP
0.5000 mL | Freq: Once | INTRAMUSCULAR | Status: DC
  Filled 2018-01-16: qty 0.5

## 2018-01-16 MED ORDER — NAPROXEN 500 MG PO TABS: 500.0000 mg | ORAL_TABLET | Freq: Two times a day (BID) | ORAL | 0 refills | Status: DC

## 2018-01-16 MED ORDER — NAPROXEN 500 MG PO TABS
500.0000 mg | ORAL_TABLET | Freq: Once | ORAL | Status: AC
  Administered 2018-01-16: 500 mg via ORAL
  Filled 2018-01-16: qty 1

## 2018-01-16 MED ORDER — CLINDAMYCIN HCL 150 MG PO CAPS: 450.0000 mg | ORAL_CAPSULE | Freq: Three times a day (TID) | ORAL | 0 refills | Status: DC

## 2018-01-16 NOTE — ED Provider Notes (Signed)
[facility]-EMERGENCY DEPT Provider Note   CSN: [id number] Arrival date & time: 01/16/18  1536     History   Chief Complaint Chief Complaint  Patient presents with  . Dental Pain    HPI Kelly Wang is a 42 y.o. female with a hx of tobacco abuse and asthma who presents the emergency department complaining of left lower dental pain for the past 1 month.  Patient states that the pain is constant.  Rates it a 20 out of 10 in severity.  Describes it as a pressure/tightness sensation.  Pain is starting to radiate into her left ear.  She tried taking some hydrocodone she had previously been prescribed with some improvement.  No other specific alleviating or aggravating factors.  Patient denies fever, chills, vomiting, or neck stiffness.   HPI  Past Medical History:  Diagnosis Date  . Asthma     Patient Active Problem List   Diagnosis Date Noted  . Moderate persistent asthma 04/19/2012  . Pollen allergies 04/19/2012  . Smoking 04/19/2012    Past Surgical History:  Procedure Laterality Date  . BREAST SURGERY      OB History    No data available       Home Medications    Prior to Admission medications   Medication Sig Start Date End Date Taking? Authorizing Provider  HYDROcodone-acetaminophen (NORCO/VICODIN) 5-325 MG tablet Take 1 tablet by mouth every 6 (six) hours as needed for moderate pain.   Yes [provider]  albuterol (PROVENTIL HFA;VENTOLIN HFA) 108 (90 BASE) MCG/ACT inhaler Inhale 1-2 puffs into the lungs every 6 (six) hours as needed for wheezing. 08/28/15   Rolland Porter, MD  albuterol (PROVENTIL) (2.5 MG/3ML) 0.083% nebulizer solution Take 3 mLs (2.5 mg total) by nebulization every 6 (six) hours as needed for wheezing or shortness of breath. 08/28/15   Rolland Porter, MD  Budesonide (PULMICORT FLEXHALER) 90 MCG/ACT inhaler Inhale 2 puffs into the lungs 2 (two) times daily. Patient not taking: Reported on 10/17/2015 08/28/15   Rolland Porter,  MD  cetirizine (ZYRTEC) 10 MG tablet Take 10 mg by mouth daily as needed for allergies.    [provider]  ipratropium-albuterol (DUONEB) 0.5-2.5 (3) MG/3ML SOLN Take 3 mLs by nebulization every 4 (four) hours as needed. Patient not taking: Reported on 01/16/2018 11/12/15   Gilda Crease, MD  loratadine (CLARITIN) 10 MG tablet Take 10 mg by mouth daily as needed for allergies.    [provider]  predniSONE (DELTASONE) 20 MG tablet Take 3 tablets (60 mg total) by mouth daily with breakfast. Patient not taking: Reported on 01/16/2018 11/12/15   Gilda Crease, MD  pseudoephedrine (SUDAFED) 60 MG tablet Take 60 mg by mouth every 4 (four) hours as needed for congestion.    [provider]  Pseudoephedrine-APAP-DM (DAYQUIL PO) Take 2 tablets by mouth every 8 (eight) hours as needed (cold symptoms).    [provider]    Family History Family History  Problem Relation Age of Onset  . Emphysema Maternal Grandfather   . Heart disease Maternal Grandmother   . Diabetes Maternal Grandmother   . Kidney failure Maternal Grandmother     Social History Social History   Tobacco Use  . Smoking status: Current Some Day Smoker    Packs/day: 0.30    Years: 6.00    Pack years: 1.80    Types: Cigarettes  . Tobacco comment: pt states she smokes a signal cig occasionally a month  Substance Use Topics  . Alcohol use: Yes    Comment: occasional drinker  . Drug use: No     Allergies   Patient has no known allergies.   Review of Systems Review of Systems  Constitutional: Negative for chills and fever.  HENT: Positive for congestion and dental problem. Negative for drooling, sore throat and trouble swallowing.   Respiratory: Negative for shortness of breath.   Musculoskeletal: Negative for neck stiffness.   Physical Exam Updated Vital Signs BP (!) 123/93 (BP Location: Left Arm)   Pulse (!) 106   Temp 98.7 F (37.1 C) (Oral)   Resp 16    LMP 12/29/2017   SpO2 100%   Physical Exam  Constitutional: She appears well-developed and well-nourished.  Non-toxic appearance. No distress.  HENT:  Head: Normocephalic and atraumatic.  Right Ear: Tympanic membrane normal.  Left Ear: Tympanic membrane normal.  Nose: Mucosal edema present.  Mouth/Throat: Uvula is midline. No trismus in the jaw. No uvula swelling. No oropharyngeal exudate, posterior oropharyngeal edema, posterior oropharyngeal erythema or tonsillar abscesses.    Patient with poor dentition throughout.  Multiple dental caries are present.  Patient is tolerating her own secretions without difficulty.  Eyes: Conjunctivae are normal. Right eye exhibits no discharge. Left eye exhibits no discharge.  Neck: Normal range of motion. Neck supple. No neck rigidity.  Submandibular compartment is soft.  Cardiovascular: Normal rate and regular rhythm.  Pulmonary/Chest: Effort normal and breath sounds normal. No respiratory distress.  Lymphadenopathy:    She has cervical adenopathy (mild left).  Neurological: She is alert.  Clear speech.   Psychiatric: She has a normal mood and affect. Her behavior is normal. Thought content normal.  Nursing note and vitals reviewed.   ED Treatments / Results  Labs (all labs ordered are listed, but only abnormal results are displayed) Labs Reviewed - No data to display  EKG  EKG Interpretation None      Radiology No results found.  Procedures Fine needle aspiration Date/Time: 01/16/2018 8:03 PM Performed by: Cherly AndersonPetrucelli, Skyleigh Windle R, PA-C Authorized by: Cherly AndersonPetrucelli, Taite Baldassari R, PA-C  Consent: Verbal consent obtained. Risks and benefits: risks, benefits and alternatives were discussed Consent given by: patient Local anesthesia used: yes Anesthesia: local infiltration  Anesthesia: Local anesthesia used: yes Local Anesthetic: lidocaine 1% without epinephrine Anesthetic total: 0.5 mL Patient tolerance: Patient tolerated the procedure  well with no immediate complications Comments: Needle aspiration of palpable area of fluctuance to the left lower medial gingiva.  A 18-gauge needle utilized with aspiration of 1 cc of purulent and bloody drainage.     Medications Ordered in ED Medications  Tdap (BOOSTRIX) injection 0.5 mL (0.5 mLs Intramuscular Refused 01/16/18 2036)  lidocaine (PF) (XYLOCAINE) 1 % injection 5 mL (5 mLs Infiltration Given by Other 01/16/18 1946)  naproxen (NAPROSYN) tablet 500 mg (500 mg Oral Given 01/16/18 2035)  clindamycin (CLEOCIN) capsule 450 mg (450 mg Oral Given 01/16/18 2035)   Initial Impression / Assessment and Plan / ED Course  I have reviewed the triage vital signs and the nursing notes.  Pertinent labs & imaging results that were available during my care of the patient were reviewed by me and considered in my medical decision making (see chart for details).  Patient presents with dental pain. Patient is nontoxic appearing, initially tachycardic and somewhat hypertensive, this improved, of note patient is afebrile.  On exam patient has poor dentition throughout.  Left lower gumline with area of erythema and swelling as well as palpable fluctuance.  Exam unconcerning for Ludwig's angina or spread of infection-patient is afebrile, she has full painless range of motion of her neck, submandibular compartment is soft, no trismus, no hot potato voice, patient is tolerating her own secretions without difficulty.  Needle aspiration performed per procedure above.  Approximately 1 cc of purulent and bloody drainage was aspirated from area of fluctuance.  Patient tolerated the procedure well.  Tetanus updated.  Will treat with Clindamycin and Naproxen- first doses given in the ED.  Stressed importance of follow-up with dentist within 48 hours, dental resources were provided.  Discussed treatment plan and need for follow up as well as return precautions. Provided opportunity for questions, patient confirmed  understanding and is agreeable to plan.  Findings and plan of care discussed with supervising physician Dr. Lynelle Doctor who personally evaluated and examined this patient and is in agreement with plan.   Vitals:   01/16/18 1558 01/16/18 1947  BP: (!) 123/93 111/90  Pulse: (!) 106 88  Resp: 16 16  Temp: 98.7 F (37.1 C)   SpO2: 100% 100%     Final Clinical Impressions(s) / ED Diagnoses   Final diagnoses:  Dental abscess    ED Discharge Orders        Ordered    clindamycin (CLEOCIN) 150 MG capsule  3 times daily     01/16/18 2011    naproxen (NAPROSYN) 500 MG tablet  2 times daily     01/16/18 2011       Kasarah Sitts R, PA-C 01/16/18 2309    Linwood Dibbles, MD 01/17/18 [phone]

## 2018-01-16 NOTE — Discharge Instructions (Signed)
Call one of the dentists offices provided to schedule an appointment for re-evaluation and further management within the next 48 hours.  ° °I have prescribed you amoxicillin which is an antibiotic to treat the infection and Naproxen which is an anti-inflammatory medicine to treat the pain.  ° °Please take all of your antibiotics until finished. You may develop abdominal discomfort or diarrhea from the antibiotic.  You may help offset this with probiotics which you can buy at the store (ask your pharmacist if unable to find) or get probiotics in the form of eating yogurt. Do not eat or take the probiotics until 2 hours after your antibiotic. If you are unable to tolerate these side effects follow-up with your primary care provider or return to the emergency department.  ° °If you begin to experience any blistering, rashes, swelling, or difficulty breathing seek medical care for evaluation of potentially more serious side effects.  ° °Be sure to eat something when taking the Naproxen as it can cause stomach upset and at worst stomach bleeding. Do not take additional non steroidal anti-inflammatory medicines such as Ibuprofen, Aleve, or Advil while taking Naproxen. You may supplement with Tylenol.  ° °Please be aware that this medications may interact with other medications you are taking, please be sure to discuss your medication list with your pharmacist. If you are taking birth control the antibiotic will deactivate your birth control for 2 weeks.  ° °If you start to experience and new or worsening symptoms return to the emergency department. If you start to experience fever, chills, neck stiffness/pain, or inability to move your neck come back to the emergency department immediately.  °

## 2018-01-16 NOTE — ED Triage Notes (Addendum)
Patient c/o left lower dental pain x1 month. Reports taking old abx with relief "but then it came back." States she does not have a dentist. Speaking in full sentences without difficulty.

## 2020-01-16 ENCOUNTER — Ambulatory Visit: Payer: Self-pay | Admitting: Emergency Medicine

## 2020-02-13 ENCOUNTER — Ambulatory Visit (INDEPENDENT_AMBULATORY_CARE_PROVIDER_SITE_OTHER): Payer: Medicaid Other | Admitting: Family Medicine

## 2020-02-13 ENCOUNTER — Other Ambulatory Visit: Payer: Self-pay

## 2020-02-13 ENCOUNTER — Other Ambulatory Visit (HOSPITAL_COMMUNITY)
Admission: RE | Admit: 2020-02-13 | Discharge: 2020-02-13 | Disposition: A | Discharge status: Home / Self Care | Payer: Medicaid Other | Source: Ambulatory Visit | Attending: Family Medicine | Admitting: Family Medicine

## 2020-02-13 ENCOUNTER — Encounter: Payer: Self-pay | Admitting: Family Medicine

## 2020-02-13 VITALS — BP 103/74 | HR 120 | Wt 169.9 lb

## 2020-02-13 DIAGNOSIS — Z01419 Encounter for gynecological examination (general) (routine) without abnormal findings: Secondary | ICD-10-CM | POA: Diagnosis not present

## 2020-02-13 DIAGNOSIS — Z0000 Encounter for general adult medical examination without abnormal findings: Secondary | ICD-10-CM | POA: Diagnosis not present

## 2020-02-13 DIAGNOSIS — Z1231 Encounter for screening mammogram for malignant neoplasm of breast: Secondary | ICD-10-CM

## 2020-02-13 NOTE — Progress Notes (Signed)
No Recent care  Thinks she is going through the 1st stages of menopause cycle are getting lighter in flow and starting to get Hot flashes.

## 2020-02-13 NOTE — Progress Notes (Signed)
GYNECOLOGY ANNUAL PREVENTATIVE CARE ENCOUNTER NOTE  History:     Kelly Wang is a 44 y.o. [phone] female here for a routine annual gynecologic exam.  She is concerned that she may be entering menopause. Reports her periods are still monthly and regular but flow has been lighter. Denies concerns for STDs but she does have one female partner and they do have unprotected intercourse. Denies abnormal vaginal bleeding, discharge, pelvic pain, problems with intercourse or other gynecologic concerns.     Gynecologic History LMP 01/17/20 Contraception: None Last Pap: unsure  Last mammogram: Never   Obstetric History OB History  Gravida Para Term Preterm AB Living  4 1 1  0 3 1  SAB TAB Ectopic Multiple Live Births    3     1    # Outcome Date GA Lbr Len/2nd Weight Sex Delivery Anes PTL Lv  4 TAB           3 TAB           2 TAB           1 Term             Past Medical History:  Diagnosis Date  . Asthma     Past Surgical History:  Procedure Laterality Date  . BREAST SURGERY      Current Outpatient Medications on File Prior to Visit  Medication Sig Dispense Refill  . albuterol (PROVENTIL HFA;VENTOLIN HFA) 108 (90 BASE) MCG/ACT inhaler Inhale 1-2 puffs into the lungs every 6 (six) hours as needed for wheezing. 1 Inhaler 0  . albuterol (PROVENTIL) (2.5 MG/3ML) 0.083% nebulizer solution Take 3 mLs (2.5 mg total) by nebulization every 6 (six) hours as needed for wheezing or shortness of breath. 75 mL 12  . cetirizine (ZYRTEC) 10 MG tablet Take 10 mg by mouth daily as needed for allergies.    . naproxen (NAPROSYN) 500 MG tablet Take 1 tablet (500 mg total) by mouth 2 (two) times daily. 30 tablet 0  . clindamycin (CLEOCIN) 150 MG capsule Take 3 capsules (450 mg total) by mouth 3 (three) times daily. (Patient not taking: Reported on 02/13/2020) 63 capsule 0  . HYDROcodone-acetaminophen (NORCO/VICODIN) 5-325 MG tablet Take 1 tablet by mouth every 6 (six) hours as needed for moderate  pain.     No current facility-administered medications on file prior to visit.    No Known Allergies  Social History:  reports that she has been smoking cigarettes. She has a 1.80 pack-year smoking history. She has never used smokeless tobacco. She reports current alcohol use. She reports that she does not use drugs.  Family History  Problem Relation Age of Onset  . Emphysema Maternal Grandfather   . Heart disease Maternal Grandmother   . Diabetes Maternal Grandmother   . Kidney failure Maternal Grandmother     The following portions of the patient's history were reviewed and updated as appropriate: allergies, current medications, past family history, past medical history, past social history, past surgical history and problem list.  Review of Systems Pertinent items noted in HPI and remainder of comprehensive ROS otherwise negative.  Physical Exam:  BP 103/74   Pulse (!) 120   Wt 169 lb 14.4 oz (77.1 kg)   BMI 29.16 kg/m  CONSTITUTIONAL: Well-developed, well-nourished female in no acute distress.  HENT:  Normocephalic, atraumatic, External right and left ear normal. EYES: Conjunctivae and EOM are normal.  No scleral icterus.  NECK: Normal range of motion, supple, no  masses.  Normal thyroid.  SKIN: Skin is warm and dry. No rash noted. Not diaphoretic. No erythema. No pallor. MUSCULOSKELETAL: Normal range of motion. No tenderness.  No cyanosis, clubbing, or edema. NEUROLOGIC: Alert and oriented to person, place, and time. Normal reflexes, muscle tone coordination.  PSYCHIATRIC: Normal mood and affect. Normal behavior. Normal judgment and thought content. CARDIOVASCULAR: Normal heart rate noted, regular rhythm RESPIRATORY: Clear to auscultation bilaterally. Effort and breath sounds normal, no problems with respiration noted. BREASTS: Symmetric in size. No masses, tenderness, skin changes, nipple drainage, or lymphadenopathy bilaterally. ABDOMEN: Soft, no distention noted.  No  tenderness, rebound or guarding.  PELVIC: Normal appearing external genitalia and urethral meatus; normal appearing vaginal mucosa and cervix.  No abnormal discharge noted.  Pap smear obtained.  Normal uterine size, no other palpable masses, no uterine or adnexal tenderness.   Assessment and Plan:     Anaston was seen today for gynecologic exam.  Diagnoses and all orders for this visit:  Women's annual routine gynecological examination Encounter for screening mammogram for malignant neoplasm of breast -     Cytology - PAP( [place]) -     Cervicovaginal ancillary only( [place]) -     HIV Antibody (routine testing w rflx) -     RPR -     Hepatitis B surface antigen -     Hepatitis C Antibody -     MM Digital Screening; Future - Routine gyn exam. BP WNL. Patient not on any chronic medications. F/u in one year or PRN.   Will follow up results of pap smear and manage accordingly. Mammogram scheduled Routine preventative health maintenance measures emphasized. Please refer to After Visit Summary for other counseling recommendations.      Jerilynn Birkenhead, MD [facility] Family Medicine Fellow, [facility] for [employer], [facility]

## 2020-02-14 LAB — CERVICOVAGINAL ANCILLARY ONLY
Bacterial Vaginitis (gardnerella): POSITIVE — AB
Candida Glabrata: NEGATIVE
Candida Vaginitis: NEGATIVE
Comment: NEGATIVE
Comment: NEGATIVE
Comment: NEGATIVE
Comment: NEGATIVE
Trichomonas: NEGATIVE

## 2020-02-14 LAB — HEPATITIS C ANTIBODY: Hep C Virus Ab: <0.1 s/co ratio (ref 0.0–0.9)

## 2020-02-14 LAB — HIV ANTIBODY (ROUTINE TESTING W REFLEX): HIV Screen 4th Generation wRfx: NONREACTIVE

## 2020-02-14 LAB — HEPATITIS B SURFACE ANTIGEN: Hepatitis B Surface Ag: NEGATIVE

## 2020-02-14 LAB — RPR: RPR Ser Ql: NONREACTIVE

## 2020-02-19 ENCOUNTER — Other Ambulatory Visit: Payer: Self-pay | Admitting: Family Medicine

## 2020-02-19 DIAGNOSIS — Z1231 Encounter for screening mammogram for malignant neoplasm of breast: Secondary | ICD-10-CM

## 2020-02-19 LAB — CYTOLOGY - PAP
Adequacy: ABSENT
Chlamydia: NEGATIVE
Comment: NEGATIVE
Comment: NEGATIVE
Comment: NORMAL
Diagnosis: NEGATIVE
High risk HPV: NEGATIVE
Neisseria Gonorrhea: NEGATIVE

## 2020-02-26 ENCOUNTER — Other Ambulatory Visit: Payer: Self-pay

## 2020-02-26 ENCOUNTER — Ambulatory Visit: Payer: Medicaid Other

## 2021-04-08 ENCOUNTER — Encounter (HOSPITAL_COMMUNITY): Payer: Self-pay

## 2021-04-08 ENCOUNTER — Ambulatory Visit: Payer: Medicaid Other | Admitting: Nurse Practitioner

## 2021-04-08 ENCOUNTER — Emergency Department (HOSPITAL_COMMUNITY)
Admission: EM | Admit: 2021-04-08 | Discharge: 2021-04-08 | Disposition: A | Discharge status: Home / Self Care | Payer: Medicaid Other | Attending: Emergency Medicine | Admitting: Emergency Medicine

## 2021-04-08 ENCOUNTER — Other Ambulatory Visit: Payer: Self-pay

## 2021-04-08 DIAGNOSIS — J4540 Moderate persistent asthma, uncomplicated: Secondary | ICD-10-CM | POA: Insufficient documentation

## 2021-04-08 DIAGNOSIS — G8929 Other chronic pain: Secondary | ICD-10-CM | POA: Insufficient documentation

## 2021-04-08 DIAGNOSIS — B999 Unspecified infectious disease: Secondary | ICD-10-CM | POA: Insufficient documentation

## 2021-04-08 DIAGNOSIS — F17210 Nicotine dependence, cigarettes, uncomplicated: Secondary | ICD-10-CM | POA: Insufficient documentation

## 2021-04-08 DIAGNOSIS — K0889 Other specified disorders of teeth and supporting structures: Secondary | ICD-10-CM | POA: Insufficient documentation

## 2021-04-08 MED ORDER — CLINDAMYCIN HCL 150 MG PO CAPS: 450.0000 mg | ORAL_CAPSULE | Freq: Three times a day (TID) | ORAL | 0 refills | Status: AC

## 2021-04-08 NOTE — Discharge Instructions (Signed)
  You were seen in the ER for dental pain.  Pain is likely from broken tooth/exposed nerve endings from cavities.  There are no signs of superimposed infection or collection of pus to require drainage but you are at high risk for abscess and infection.   Take antibiotic as prescribed. This should help with an early infection and the pain.   Dental pain is difficult to control because most of the pain is from exposed nerve endings.  Take [phone] mg of acetaminophen (Tylenol) every 6 hours.  For more pain control take ibuprofen 600 mg every 6 hours.  You can combine acetaminophen and ibuprofen as above every 6 hours for maximum pain and inflammation control.  Use pea sized amount of Orajel 15 to 20 minutes around area of pain and exposed nerves prior to eating or drinking to desensitize nerve endings.  You can apply a pea sized amount of desensitizing toothpastes (Sensodyne, Pronamel, [employer] sensitive) throughout the day to help with nerve pain and sensitivity.  Warm water and salt soaks can help with inflammation and drainage formation.  Unfortunately, dental pain will continue until you have a full dental evaluation and treatment.  See dental resources attached.  Look into [facility] dental school or urgent tooth  Return to the ER for fevers, facial or gum line swelling, redness, pus.

## 2021-04-08 NOTE — ED Provider Notes (Signed)
[facility]-EMERGENCY DEPT Provider Note   CSN: [id number] Arrival date & time: 04/08/21  1301     History Chief Complaint  Patient presents with  . Oral Infection    Kelly Wang is a 45 y.o. female presents to ER for evaluation of "oral infection".  Reports swelling and discomfort mostly in left lower gumline and teeth for over a year. Came to the ER a year ago and was prescribed penicillin which helped for a long time but states the symptoms have returned. Last year she had drainage of an abscess. States this time she came sooner to prevent this.  States she knows she needs to go to a dentist but has no insurance. No fevers, chills, sore throat.  She feels like her entire left side of her face, head, neck are swollen and wants an x-ray.    HPI     Past Medical History:  Diagnosis Date  . Asthma     Patient Active Problem List   Diagnosis Date Noted  . Moderate persistent asthma 04/19/2012  . Pollen allergies 04/19/2012  . Smoking 04/19/2012    Past Surgical History:  Procedure Laterality Date  . BREAST SURGERY       OB History    Gravida  4   Para  1   Term  1   Preterm  0   AB  3   Living  1     SAB      IAB  3   Ectopic      Multiple      Live Births  1           Family History  Problem Relation Age of Onset  . Emphysema Maternal Grandfather   . Heart disease Maternal Grandmother   . Diabetes Maternal Grandmother   . Kidney failure Maternal Grandmother     Social History   Tobacco Use  . Smoking status: Current Some Day Smoker    Packs/day: 0.30    Years: 6.00    Pack years: 1.80    Types: Cigarettes  . Smokeless tobacco: Never Used  . Tobacco comment: pt states she smokes a signal cig occasionally a month  Substance Use Topics  . Alcohol use: Yes    Comment: occasional drinker  . Drug use: No    Home Medications Prior to Admission medications   Medication Sig Start Date End Date Taking?  Authorizing Provider  clindamycin (CLEOCIN) 150 MG capsule Take 3 capsules (450 mg total) by mouth 3 (three) times daily for 10 days. 04/08/21 04/18/21 Yes Liberty Handy, PA-C  albuterol (PROVENTIL HFA;VENTOLIN HFA) 108 (90 BASE) MCG/ACT inhaler Inhale 1-2 puffs into the lungs every 6 (six) hours as needed for wheezing. 08/28/15   Rolland Porter, MD  albuterol (PROVENTIL) (2.5 MG/3ML) 0.083% nebulizer solution Take 3 mLs (2.5 mg total) by nebulization every 6 (six) hours as needed for wheezing or shortness of breath. 08/28/15   Rolland Porter, MD  cetirizine (ZYRTEC) 10 MG tablet Take 10 mg by mouth daily as needed for allergies.    [provider]  HYDROcodone-acetaminophen (NORCO/VICODIN) 5-325 MG tablet Take 1 tablet by mouth every 6 (six) hours as needed for moderate pain.    [provider]  naproxen (NAPROSYN) 500 MG tablet Take 1 tablet (500 mg total) by mouth 2 (two) times daily. 01/16/18   Petrucelli, Pleas Koch, PA-C    Allergies    Patient has no known allergies.  Review of Systems  Review of Systems  HENT: Positive for dental problem.   All other systems reviewed and are negative.   Physical Exam Updated Vital Signs BP 127/78 (BP Location: Left Arm)   Pulse 98   Temp 98.7 F (37.1 C) (Oral)   Resp 16   SpO2 97%   Physical Exam Constitutional:      Appearance: She is well-developed.  HENT:     Head: Normocephalic.     Nose: Nose normal.     Mouth/Throat:     Dentition: Abnormal dentition.     Comments: Several silver fillings on left lower molars and premolars. Obvious decay noted.  Mild tenderness with percussion and bite down on left lower molars and premolars.  No abscess. Normal oropharynx and tonsils. Normal SL space. No stridor, drooling. Normal phonation.  Eyes:     General: Lids are normal.  Neck:     Trachea: Trachea normal.  Cardiovascular:     Rate and Rhythm: Normal rate.  Pulmonary:     Effort: Pulmonary effort is normal. No respiratory  distress.  Musculoskeletal:        General: Normal range of motion.     Cervical back: Normal range of motion. No edema. No pain with movement.  Lymphadenopathy:     Cervical: No cervical adenopathy.  Neurological:     Mental Status: She is alert.  Psychiatric:        Behavior: Behavior normal.     ED Results / Procedures / Treatments   Labs (all labs ordered are listed, but only abnormal results are displayed) Labs Reviewed - No data to display  EKG None  Radiology No results found.  Procedures Procedures   Medications Ordered in ED Medications - No data to display  ED Course  I have reviewed the triage vital signs and the nursing notes.  Pertinent labs & imaging results that were available during my care of the patient were reviewed by me and considered in my medical decision making (see chart for details).    MDM Rules/Calculators/A&P                          Suspect symptoms secondary to dentin/nerve exposure from cracked teeth causing nerve irritation vs early infection.  No signs of dental abscess on exam.  Afebrile, non toxic appearing, swallowing secretions well without hot potato voice, drooling, trismus, asymmetric facial or mandibular or maxillary edema. Clinically this is not consistent with Ludwig's angina, severe dental or facial infectious process or abscess, or other deep tissue infection in neck, jaw, face, throat.  Will treat with clindamycin, NSAIDs, warm salt soaks.  Encouraged patient to follow-up with dentist.  Patient given dentist contact info, encouraged to follow up for ultimate management of dental pain and overall dental health. Strict ED return precautions given. Pt is aware of red flag symptoms that would warrant return to ED for re-evaluation and further treatment. Patient voices understanding and is agreeable to plan.    Final Clinical Impression(s) / ED Diagnoses Final diagnoses:  Chronic dental pain    Rx / DC Orders ED Discharge  Orders         Ordered    clindamycin (CLEOCIN) 150 MG capsule  3 times daily        04/08/21 1338           Liberty Handy, [place] 04/08/21 1338    Horton, Clabe Seal, DO 04/09/21 2001

## 2021-04-08 NOTE — ED Notes (Signed)
An After Visit Summary was printed and given to the patient. Discharge instructions given and no further questions at this time.  

## 2021-04-08 NOTE — ED Triage Notes (Signed)
Pt states she has a reoccurring oral infection on the left side of her mouth, last time was over a year ago. Pt states she knows she needs antibiotics.

## 2021-05-20 ENCOUNTER — Ambulatory Visit: Payer: Self-pay | Admitting: Nurse Practitioner

## 2021-05-20 ENCOUNTER — Other Ambulatory Visit: Payer: Self-pay

## 2021-07-30 ENCOUNTER — Ambulatory Visit: Payer: Medicaid Other | Admitting: Nurse Practitioner

## 2022-09-20 ENCOUNTER — Emergency Department (HOSPITAL_COMMUNITY)
Admission: EM | Admit: 2022-09-20 | Discharge: 2022-09-20 | Disposition: A | Discharge status: Home / Self Care | Payer: No Typology Code available for payment source | Attending: Emergency Medicine | Admitting: Emergency Medicine

## 2022-09-20 ENCOUNTER — Encounter (HOSPITAL_COMMUNITY): Payer: Self-pay | Admitting: Emergency Medicine

## 2022-09-20 DIAGNOSIS — Y92410 Unspecified street and highway as the place of occurrence of the external cause: Secondary | ICD-10-CM | POA: Diagnosis not present

## 2022-09-20 DIAGNOSIS — S5012XA Contusion of left forearm, initial encounter: Secondary | ICD-10-CM | POA: Diagnosis not present

## 2022-09-20 DIAGNOSIS — S59912A Unspecified injury of left forearm, initial encounter: Secondary | ICD-10-CM | POA: Diagnosis present

## 2022-09-20 DIAGNOSIS — S4992XA Unspecified injury of left shoulder and upper arm, initial encounter: Secondary | ICD-10-CM

## 2022-09-20 MED ORDER — KETOROLAC TROMETHAMINE 30 MG/ML IJ SOLN
30.0000 mg | Freq: Once | INTRAMUSCULAR | Status: AC
  Administered 2022-09-20: 30 mg via INTRAMUSCULAR
  Filled 2022-09-20: qty 1
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
## 2022-09-20 NOTE — Discharge Instructions (Signed)
Please alternate 650 mg of tylenol with 600 mg of Ibuprofen every 4 hours for one week. Continue elevation. Avoid ice and start trying heat to your arm. Please call the number listed for Dr. Ninfa Linden to schedule follow up visit with Orthopedics.

## 2023-02-07 ENCOUNTER — Encounter: Payer: Self-pay | Admitting: Physician Assistant

## 2023-02-07 ENCOUNTER — Telehealth: Payer: Medicaid Other | Admitting: Physician Assistant

## 2023-02-07 DIAGNOSIS — J069 Acute upper respiratory infection, unspecified: Secondary | ICD-10-CM

## 2023-02-07 MED ORDER — COVID-19 AT HOME ANTIGEN TEST VI KIT: PACK | 0 refills | Status: DC

## 2023-02-07 MED ORDER — BENZONATATE 100 MG PO CAPS: 100.0000 mg | ORAL_CAPSULE | Freq: Three times a day (TID) | ORAL | 0 refills | Status: DC | PRN

## 2023-02-07 NOTE — Patient Instructions (Signed)
  Kelly Wang, thank you for joining Leeanne Rio, PA-C for today's virtual visit.  While this provider is not your primary care provider (PCP), if your PCP is located in our provider database this encounter information will be shared with them immediately following your visit.   [facility] account gives you access to today's visit and all your visits, tests, and labs performed at [facility] " Wang here if you don't have a [facility] account or go to [email or web address]  Consent: (Patient) Kelly Wang provided verbal consent for this virtual visit at the beginning of the encounter.  Current Medications:  Current Outpatient Medications:    albuterol (PROVENTIL HFA;VENTOLIN HFA) 108 (90 BASE) MCG/ACT inhaler, Inhale 1-2 puffs into the lungs every 6 (six) hours as needed for wheezing., Disp: 1 Inhaler, Rfl: 0   albuterol (PROVENTIL) (2.5 MG/3ML) 0.083% nebulizer solution, Take 3 mLs (2.5 mg total) by nebulization every 6 (six) hours as needed for wheezing or shortness of breath., Disp: 75 mL, Rfl: 12   cetirizine (ZYRTEC) 10 MG tablet, Take 10 mg by mouth daily as needed for allergies., Disp: , Rfl:    HYDROcodone-acetaminophen (NORCO/VICODIN) 5-325 MG tablet, Take 1 tablet by mouth every 6 (six) hours as needed for moderate pain., Disp: , Rfl:    naproxen (NAPROSYN) 500 MG tablet, Take 1 tablet (500 mg total) by mouth 2 (two) times daily., Disp: 30 tablet, Rfl: 0   Medications ordered in this encounter:  No orders of the defined types were placed in this encounter.    *If you need refills on other medications prior to your next appointment, please contact your pharmacy*  Follow-Up: Call back or seek an in-person evaluation if the symptoms worsen or if the condition fails to improve as anticipated.  [place] [phone]  Other Instructions Take the home COVID test and message [place] with the results.  Keep  hydrated and rest. Start the [facility] as directed for cough. Keep up with the Mucinex.  If you have a humidifier, run it in the bedroom at night.     If you have been instructed to have an in-person evaluation today at a local Urgent Care facility, please use the link below. It will take you to a list of all of our available [facility], including address, phone number and hours of operation. Please do not delay care.  [facility]  If you or a family member do not have a primary care provider, use the link below to schedule a visit and establish care. When you choose a [facility] primary care physician or advanced practice provider, you gain a long-term partner in health. Find a Primary Care Provider  Learn more about [facility]'s in-office and virtual care options: [facility]

## 2023-02-07 NOTE — Progress Notes (Signed)
Virtual Visit Consent   Kelly Wang, you are scheduled for a virtual visit with a [facility] provider today. Just as with appointments in the office, your consent must be obtained to participate. Your consent will be active for this visit and any virtual visit you may have with one of our providers in the next 365 days. If you have a MyChart account, a copy of this consent can be sent to you electronically.  As this is a virtual visit, video technology does not allow for your provider to perform a traditional examination. This may limit your provider's ability to fully assess your condition. If your provider identifies any concerns that need to be evaluated in person or the need to arrange testing (such as labs, EKG, etc.), we will make arrangements to do so. Although advances in technology are sophisticated, we cannot ensure that it will always work on either your end or our end. If the connection with a video visit is poor, the visit may have to be switched to a telephone visit. With either a video or telephone visit, we are not always able to ensure that we have a secure connection.  By engaging in this virtual visit, you consent to the provision of healthcare and authorize for your insurance to be billed (if applicable) for the services provided during this visit. Depending on your insurance coverage, you may receive a charge related to this service.  I need to obtain your verbal consent now. Are you willing to proceed with your visit today? Kelly Wang has provided verbal consent on 02/07/2023 for a virtual visit (video or telephone). Leeanne Rio, [place]  Date: 02/07/2023 3:30 PM  Virtual Visit via Video Note   I, Leeanne Rio, connected with  Kelly Wang  ([id number], 1976-09-16) on 02/07/23 at  3:30 PM EST by a video-enabled telemedicine application and verified that I am speaking with the correct person using two identifiers.  Location: Patient:  Virtual Visit Location Patient: Home Provider: Virtual Visit Location Provider: Home Office   I discussed the limitations of evaluation and management by telemedicine and the availability of in person appointments. The patient expressed understanding and agreed to proceed.    History of Present Illness: Kelly Wang is a 47 y.o. who identifies as a female who was assigned female at birth, and is being seen today for 3 days of URI symptoms including sore throat, cough and fatigue. Denies fever, chills. Cough is dry mainly but sometimes productive. Denies ear pain or tooth pain. Notes a headache. Works at a retirement community. Is staying well-hydrated. Has not taken a home COVID test.   OTC -- Robitussin  HPI: HPI  Problems:  Patient Active Problem List   Diagnosis Date Noted   Moderate persistent asthma 04/19/2012   Pollen allergies 04/19/2012   Smoking 04/19/2012    Allergies: No Known Allergies Medications:  Current Outpatient Medications:    benzonatate (TESSALON) 100 MG capsule, Take 1 capsule (100 mg total) by mouth 3 (three) times daily as needed for cough., Disp: 30 capsule, Rfl: 0   COVID-19 At Home Antigen Test KIT, Use as directed., Disp: 1 kit, Rfl: 0  Observations/Objective: Patient is well-developed, well-nourished in no acute distress.  Resting comfortably at home.  Head is normocephalic, atraumatic.  No labored breathing. Speech is clear and coherent with logical content.  Patient is alert and oriented at baseline.   Assessment and Plan: 1. Viral URI with cough - COVID-19 At Home Antigen  Test KIT; Use as directed.  Dispense: 1 kit; Refill: 0 - benzonatate (TESSALON) 100 MG capsule; Take 1 capsule (100 mg total) by mouth 3 (three) times daily as needed for cough.  Dispense: 30 capsule; Refill: 0  Giving she works in a higher risk environment, recommend COVID testing ASAP. Order sent in for home kit to hopefully make cheaper. She is to message me directly  with her test results so further determination can be made. She is to increase fluids. Supportive measures, OTC medications and vitamin recommendations reviewed. Tessalon per orders. If COVID positive, will further discuss quarantine and possibility of antivirals.   Follow Up Instructions: I discussed the assessment and treatment plan with the patient. The patient was provided an opportunity to ask questions and all were answered. The patient agreed with the plan and demonstrated an understanding of the instructions.  A copy of instructions were sent to the patient via MyChart unless otherwise noted below.    The patient was advised to call back or seek an in-person evaluation if the symptoms worsen or if the condition fails to improve as anticipated.  Time:  I spent 10 minutes with the patient via telehealth technology discussing the above problems/concerns.    Leeanne Rio, PA-C

## 2023-09-10 ENCOUNTER — Ambulatory Visit (HOSPITAL_BASED_OUTPATIENT_CLINIC_OR_DEPARTMENT_OTHER)
Admission: RE | Admit: 2023-09-10 | Discharge: 2023-09-10 | Disposition: A | Discharge status: Home / Self Care | Payer: No Typology Code available for payment source | Source: Ambulatory Visit

## 2023-09-10 ENCOUNTER — Other Ambulatory Visit (HOSPITAL_BASED_OUTPATIENT_CLINIC_OR_DEPARTMENT_OTHER): Payer: Self-pay

## 2023-09-10 ENCOUNTER — Encounter (HOSPITAL_BASED_OUTPATIENT_CLINIC_OR_DEPARTMENT_OTHER): Payer: Self-pay

## 2023-09-10 DIAGNOSIS — Z1231 Encounter for screening mammogram for malignant neoplasm of breast: Secondary | ICD-10-CM

## 2023-09-15 ENCOUNTER — Encounter: Payer: Self-pay | Admitting: Internal Medicine

## 2023-09-15 ENCOUNTER — Ambulatory Visit (INDEPENDENT_AMBULATORY_CARE_PROVIDER_SITE_OTHER): Payer: Medicaid Other | Admitting: Internal Medicine

## 2023-09-15 VITALS — BP 96/60 | HR 97 | Temp 97.7°F | Ht 64.0 in | Wt 160.2 lb

## 2023-09-15 DIAGNOSIS — Z720 Tobacco use: Secondary | ICD-10-CM | POA: Diagnosis not present

## 2023-09-15 DIAGNOSIS — K047 Periapical abscess without sinus: Secondary | ICD-10-CM | POA: Diagnosis not present

## 2023-09-15 DIAGNOSIS — J4520 Mild intermittent asthma, uncomplicated: Secondary | ICD-10-CM | POA: Diagnosis not present

## 2023-09-15 DIAGNOSIS — N6314 Unspecified lump in the right breast, lower inner quadrant: Secondary | ICD-10-CM

## 2023-09-15 DIAGNOSIS — Z1231 Encounter for screening mammogram for malignant neoplasm of breast: Secondary | ICD-10-CM

## 2023-09-15 MED ORDER — AMOXICILLIN-POT CLAVULANATE 875-125 MG PO TABS
1.0000 | ORAL_TABLET | Freq: Two times a day (BID) | ORAL | 0 refills | Status: AC
Start: 2023-09-15 — End: 2023-09-22

## 2023-09-15 MED ORDER — ALBUTEROL SULFATE HFA 108 (90 BASE) MCG/ACT IN AERS: 2.0000 | INHALATION_SPRAY | RESPIRATORY_TRACT | 3 refills | Status: DC | PRN

## 2023-09-15 NOTE — Progress Notes (Signed)
[facility] [address] [place] [address] Dept: [phone] Dept Fax: [phone]  New Patient Office Visit  Subjective:   Kelly Wang 1976/11/17 09/15/2023  Chief Complaint  Patient presents with   Establish Care    Referral mammogram Tooth infection     HPI: Kelly Wang presents today to establish care at [facility]. Introduced to Publishing rights manager role and practice setting.  All questions answered.  Concerns: See below   BREAST LUMP: Patient reports noticing lump to right breast onset 1-2 months ago. No nipple dimpling or discharge. No pain. Does report having cyst removal on R. Breast at age 47. Paternal Grandmother was diagnosed with BCA in past.   TOOTH INFECTION:  Patient c/o infection to bottom left tooth onset approximately 2 years ago. Patient went to ER, was given clindamycin to help.  Does not have dental home. She reports tooth has begun to cause pain pain. No fever, trismus, drooling.   ASTHMA: Kelly Wang presents for the medical management of asthma.  Medication regimen: albuterol PRN  Well controlled: yes  Denies worsening SHOB, cough, wheezing She does smoke tobacco, approximately 1/4 ppd  The following portions of the patient's history were reviewed and updated as appropriate: past medical history, past surgical history, family history, social history, allergies, medications, and problem list.   Patient Active Problem List   Diagnosis Date Noted   Tobacco use 09/15/2023   Mass of lower inner quadrant of right breast 09/15/2023   Moderate persistent asthma 04/19/2012   Pollen allergies 04/19/2012   Smoking 04/19/2012   Past Medical History:  Diagnosis Date   Asthma    Past Surgical History:  Procedure Laterality Date   BREAST SURGERY     Family History  Problem Relation Age of Onset   Emphysema Maternal Grandfather     Heart disease Maternal Grandmother    Diabetes Maternal Grandmother    Kidney failure Maternal Grandmother     Current Outpatient Medications:    amoxicillin-clavulanate (AUGMENTIN) 875-125 MG tablet, Take 1 tablet by mouth 2 (two) times daily for 7 days., Disp: 14 tablet, Rfl: 0   albuterol (VENTOLIN HFA) 108 (90 Base) MCG/ACT inhaler, Inhale 2 puffs into the lungs every 4 (four) hours as needed for wheezing or shortness of breath., Disp: 18 g, Rfl: 3 No Known Allergies  ROS: A complete ROS was performed with pertinent positives/negatives noted in the HPI. The remainder of the ROS are negative.   Objective:   Today's Vitals   09/15/23 1429  BP: 96/60  Pulse: 97  Temp: 97.7 F (36.5 C)  TempSrc: Temporal  SpO2: 97%  Weight: 160 lb 3.2 oz (72.7 kg)  Height: 5\' 4"  (1.626 m)    GENERAL: Well-appearing, in NAD. Well nourished.  SKIN: Pink, warm and dry. No rash.  HEENT:    HEAD: Normocephalic, non-traumatic.  EYES: Conjunctive pink without exudate.  THROAT: Uvula midline. Oropharynx clear. Tonsils non-inflamed w/o exudate. Mucus membranes pink and moist. TEETH: several silver dental fillings to left lower molars, decay present. Missing upper molars and partially broken #13 tooth. No abscess,stridor, drooling, trismus.  NECK: Trachea midline. Full ROM w/o pain or tenderness. No lymphadenopathy.  BREASTS: Breasts pendulous, symmetrical. Pea-size moveable non-tender lump to R. Inner breast at 9 o clock position.  Nipples everted and w/o discharge. No rash or skin retraction. No axillary or supraclavicular lymphadenopathy. RESPIRATORY: Chest wall symmetrical. Respirations even and non-labored. Breath sounds clear to  auscultation bilaterally.  CARDIAC: S1, S2 present, regular rate and rhythm. Peripheral pulses 2+ bilaterally.  EXTREMITIES: Without clubbing, cyanosis, or edema.  NEUROLOGIC: No motor or sensory deficits. Steady, even gait.  PSYCH/MENTAL STATUS: Alert, oriented x 3.  Cooperative, appropriate mood and affect.   Health Maintenance Due  Topic Date Due   Colonoscopy  Never done    No results found for any visits on 09/15/23.  Assessment & Plan:  1. Tooth infection - amoxicillin-clavulanate (AUGMENTIN) 875-125 MG tablet; Take 1 tablet by mouth 2 (two) times daily for 7 days.  Dispense: 14 tablet; Refill: 0 - dental info provided   2. Tobacco use - advised smoking cessation   3. Mild intermittent asthma without complication - albuterol (VENTOLIN HFA) 108 (90 Base) MCG/ACT inhaler; Inhale 2 puffs into the lungs every 4 (four) hours as needed for wheezing or shortness of breath.  Dispense: 18 g; Refill: 3  4. Mass of lower inner quadrant of right breast - MM 3D DIAGNOSTIC MAMMOGRAM UNILATERAL RIGHT BREAST; Future  5. Encounter for screening mammogram for malignant neoplasm of breast - MM 3D SCREENING MAMMOGRAM UNILATERAL LEFT BREAST; Future   Orders Placed This Encounter  Procedures   MM 3D SCREENING MAMMOGRAM UNILATERAL LEFT BREAST    Standing Status:   Future    Standing Expiration Date:   09/14/2024    Order Specific Question:   Reason for Exam (SYMPTOM  OR DIAGNOSIS REQUIRED)    Answer:   screening mammo    Order Specific Question:   Is the patient pregnant?    Answer:   No    Order Specific Question:   Preferred imaging location?    [facility]   MM 3D DIAGNOSTIC MAMMOGRAM UNILATERAL RIGHT BREAST    Standing Status:   Future    Standing Expiration Date:   09/14/2024    Order Specific Question:   Reason for Exam (SYMPTOM  OR DIAGNOSIS REQUIRED)    Answer:   breast lump    Order Specific Question:   Is the patient pregnant?    Answer:   No    Order Specific Question:   Preferred imaging location?    Answer:   [facility]   Meds ordered this encounter  Medications   albuterol (VENTOLIN HFA) 108 (90 Base) MCG/ACT inhaler    Sig: Inhale 2 puffs into the lungs every 4 (four) hours as needed for wheezing or shortness of  breath.    Dispense:  18 g    Refill:  3    Order Specific Question:   Supervising Provider    Answer:   Garnette Gunner [[id number]]   amoxicillin-clavulanate (AUGMENTIN) 875-125 MG tablet    Sig: Take 1 tablet by mouth 2 (two) times daily for 7 days.    Dispense:  14 tablet    Refill:  0    Order Specific Question:   Supervising Provider    Answer:   Garnette Gunner [[id number]]    Return in about 6 months (around 03/14/2024) for asthma, health screenings.   Salvatore Decent, FNP

## 2023-09-15 NOTE — Patient Instructions (Addendum)
[email or web address]     Dental list  These dentists all accept Medicaid.  The list is a courtesy and for your convenience. Estos dentistas aceptan Medicaid.  La lista es para su [place] y es una cortesa.     [facility]     [phone] [address]  [address] [place] [address] Se habla espaol From 64 to 47 years old Parent may go with child only for cleaning Vinson Moselle DDS     [phone] Milus Banister, DDS (Spanish speaking) [address] [place]  [address] Se habla espaol New patients 8 and under, established until 18y.o Parent may go with child if needed  Marolyn Hammock DMD    [phone] [address] [place] [address] Se habla espaol [place] spoken From 6 years old Parent may go with child [facility]     [phone] [address] [place] [address] Se habla espaol, translation line, prefer for translator to be present  From 34 to 6 years old Ages 1-3y parents may go back 4+ go back by themselves parents can watch at "bay area"  [place] DDS  [phone] [facility]      [address]  Ginette Otto [place] [address] Se habla espaol [place] spoken (preferred to bring translator) From teeth coming in to 68 years old Parent may go with child  [facility] Dept.     [phone] [address] [place]. [place] [address] Requires certification. Call for information. Requiere certificacin. Llame para informacin. Algunos dias se habla espaol  From birth to 20 years Parent possibly goes with child   Bradd Canary DDS     [phone] [address]  [address] [place] [address] Se habla espaol From 4 to 18 years  Parent may NOT go with child  [facility]. [facility] [place] DDS     Garlon Hatchet DDS  [phone] [address] [place] [address] Se habla espaol- phone interpreters Ages 10 years and  older Parent may go with child- 15+ go back alone   Melynda Ripple DDS    [phone] [address] [place] [address] Se habla espaol , 3 of their providers speak [place] From 18 months to 69 years old Parent may go with child [facility]  [phone] [address] Ginette [place] [address] Se habla espanol Interpretation for other languages Special needs children welcome Ages 72 and under  [facility]    [phone] [address] [place] [address] No se habla espaol From birth [facility]   [phone] Dr. Orlean Patten [address] [place], [place] [address] From birth to 93 y- new patients 10 and under Special needs children welcome   [facility] [phone] Se habla espaol [address] [place], [place] [address]  6 month to 19 years  [facility] Janyth Pupa [phone] [address] [place], [place] [address]  Se habla espaol 6 months and up, highest age is 16-17 for new patients, will see established patients until 70 y.o Parents may go back with child

## 2023-09-19 ENCOUNTER — Other Ambulatory Visit: Payer: Self-pay | Admitting: Internal Medicine

## 2023-09-19 DIAGNOSIS — N6314 Unspecified lump in the right breast, lower inner quadrant: Secondary | ICD-10-CM

## 2023-09-27 ENCOUNTER — Other Ambulatory Visit: Payer: Self-pay | Admitting: Internal Medicine

## 2023-09-27 ENCOUNTER — Ambulatory Visit
Admission: RE | Admit: 2023-09-27 | Discharge: 2023-09-27 | Disposition: A | Discharge status: Home / Self Care | Payer: Medicaid Other | Source: Ambulatory Visit | Attending: Internal Medicine | Admitting: Internal Medicine

## 2023-09-27 DIAGNOSIS — N6314 Unspecified lump in the right breast, lower inner quadrant: Secondary | ICD-10-CM

## 2023-09-27 DIAGNOSIS — N6310 Unspecified lump in the right breast, unspecified quadrant: Secondary | ICD-10-CM

## 2023-10-03 ENCOUNTER — Ambulatory Visit
Admission: RE | Admit: 2023-10-03 | Discharge: 2023-10-03 | Disposition: A | Discharge status: Home / Self Care | Payer: Medicaid Other | Source: Ambulatory Visit | Attending: Internal Medicine | Admitting: Internal Medicine

## 2023-10-03 DIAGNOSIS — N6310 Unspecified lump in the right breast, unspecified quadrant: Secondary | ICD-10-CM

## 2023-10-03 HISTORY — PX: BREAST BIOPSY: SHX20

## 2023-10-04 LAB — SURGICAL PATHOLOGY

## 2023-10-06 ENCOUNTER — Telehealth: Payer: Self-pay

## 2023-10-06 NOTE — Telephone Encounter (Signed)
Called patient and she will call her insurance to see who is in network and then call us back so the surgical referral can be placed. Dm/cma

## 2023-10-06 NOTE — Telephone Encounter (Signed)
-----   Message from Eulis Foster sent at 10/05/2023  9:26 PM EDT ----- Regarding: FW: Surgical Referral Please let me know if patient has a preferred surgeon that may be in network with her insurance and we can refer. ----- Message ----- From: Tollie Eth, RN Sent: 10/05/2023  10:41 AM EDT To: Eulis Foster, FNP; Salvatore Decent, FNP Subject: Surgical Referral                              Good morning,  This patient had a RIGHT breast biopsy on 10/03/23.  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 Amie Portland, Radiologist.  I referred the patient to [facility] yesterday.  They do not accept her insurance.  I will need someone in your office to arrange a referral to a provider in her network.  We do not make referrals outside of [facility].  Thank you,  Rene Kocher, RN, Nurse Navigator [facility]

## 2023-10-10 ENCOUNTER — Encounter: Payer: Medicaid Other | Admitting: Obstetrics and Gynecology

## 2023-10-31 ENCOUNTER — Encounter: Payer: Self-pay | Admitting: Family Medicine

## 2023-10-31 ENCOUNTER — Encounter: Payer: Medicaid Other | Admitting: Family Medicine

## 2023-10-31 NOTE — Progress Notes (Signed)
Patient did not keep appointment today. She may call to reschedule.  

## 2024-03-07 NOTE — Progress Notes (Unsigned)
 Subjective:   Kelly Wang 1976/10/26  03/08/2024   CC: No chief complaint on file.   HPI: Kelly Wang is a 48 y.o. female who presents for a routine health maintenance exam.  Labs collected at time of visit.    HEALTH SCREENINGS: - Pap smear: up to date - Mammogram (40+): {Blank single:19197::"Up to date","Ordered today","Not applicable","Refused","Done elsewhere"}  - Colonoscopy (45+): {Blank single:19197::"Up to date","Ordered today","Not applicable","Refused","Done elsewhere"}  - Bone Density (65+): Not applicable  - Lung CA screening with low-dose CT:  Not applicable Adults age 28-80 who are current cigarette smokers or quit within the last 15 years. Must have 20 pack year history.   Depression and Anxiety Screen done today and results listed below:     02/13/2020    2:47 PM  Depression screen PHQ 2/9  Down, Depressed, Hopeless 1  PHQ - 2 Score 1  Altered sleeping 3  Tired, decreased energy 1  Change in appetite 1  Feeling bad or failure about yourself  0  Trouble concentrating 1  Moving slowly or fidgety/restless 0  Suicidal thoughts 0  PHQ-9 Score 7      02/13/2020    2:47 PM  GAD 7 : Generalized Anxiety Score  Nervous, Anxious, on Edge 1  Control/stop worrying 1  Worry too much - different things 1  Trouble relaxing 1  Restless 1  Easily annoyed or irritable 1  Afraid - awful might happen 1  Total GAD 7 Score 7    IMMUNIZATIONS: - Tdap: Tetanus vaccination status reviewed: {tetanus status:315746}. - HPV: {Blank single:19197::"Up to date","Administered today","Not applicable","Refused","Given elsewhere"} - Influenza: {Blank single:19197::"Up to date","Administered today","Postponed to flu season","Refused","Given elsewhere"} - Pneumovax: {Blank single:19197::"Up to date","Administered today","Not applicable","Refused","Given elsewhere"} - Prevnar 20: {Blank single:19197::"Up to date","Administered today","Not  applicable","Refused","Given elsewhere"} - Zostavax (50+): {Blank single:19197::"Up to date","Administered today","Not applicable","Refused","Given elsewhere"}   Past medical history, surgical history, medications, allergies, family history and social history reviewed with patient today and changes made to appropriate areas of the chart.   Past Medical History:  Diagnosis Date   Asthma     Past Surgical History:  Procedure Laterality Date   BREAST BIOPSY Right 10/03/2023   [place] RT BREAST BX W LOC DEV 1ST LESION IMG BX SPEC US GUIDE 10/03/2023 [facility]   BREAST EXCISIONAL BIOPSY Right    17-18 yrs old   BREAST SURGERY      Current Outpatient Medications on File Prior to Visit  Medication Sig   albuterol (VENTOLIN HFA) 108 (90 Base) MCG/ACT inhaler Inhale 2 puffs into the lungs every 4 (four) hours as needed for wheezing or shortness of breath.   No current facility-administered medications on file prior to visit.    No Known Allergies   Social History   Socioeconomic History   Marital status: Single    Spouse name: Not on file   Number of children: Not on file   Years of education: Not on file   Highest education level: Associate degree: occupational, Scientist, product/process development, or vocational program  Occupational History   Occupation: hairdresser    Employer: [employer]  Tobacco Use   Smoking status: Some Days    Current packs/day: 0.30    Average packs/day: 0.3 packs/day for 6.0 years (1.8 ttl pk-yrs)    Types: Cigarettes   Smokeless tobacco: Never   Tobacco comments:    pt states she smokes a signal cig occasionally a month  Substance and Sexual Activity   Alcohol use: Yes    Comment: occasional  drinker   Drug use: No   Sexual activity: Not Currently  Other Topics Concern   Not on file  Social History Narrative   Not on file   Social Drivers of Health   Financial Resource Strain: Medium Risk (09/13/2023)   Overall Financial Resource Strain (CARDIA)    Difficulty  of Paying Living Expenses: Somewhat hard  Food Insecurity: Food Insecurity Present (09/13/2023)   Hunger Vital Sign    Worried About Running Out of Food in the Last Year: Sometimes true    Ran Out of Food in the Last Year: Sometimes true  Transportation Needs: Unmet Transportation Needs (09/13/2023)   PRAPARE - Transportation    Lack of Transportation (Medical): Yes    Lack of Transportation (Non-Medical): Yes  Physical Activity: Insufficiently Active (09/13/2023)   Exercise Vital Sign    Days of Exercise per Week: 1 day    Minutes of Exercise per Session: 30 min  Stress: Stress Concern Present (09/13/2023)   [employer] of Occupational Health - Occupational Stress Questionnaire    Feeling of Stress : To some extent  Social Connections: Unknown (09/13/2023)   Social Connection and Isolation Panel [NHANES]    Frequency of Communication with Friends and Family: More than three times a week    Frequency of Social Gatherings with Friends and Family: Twice a week    Attends Religious Services: 1 to 4 times per year    Active Member of [employer] or Organizations: Not on file    Attends Engineer, structural: Not on file    Marital Status: Never married  Intimate Partner Violence: Not on file   Social History   Tobacco Use  Smoking Status Some Days   Current packs/day: 0.30   Average packs/day: 0.3 packs/day for 6.0 years (1.8 ttl pk-yrs)   Types: Cigarettes  Smokeless Tobacco Never  Tobacco Comments   pt states she smokes a signal cig occasionally a month   Social History   Substance and Sexual Activity  Alcohol Use Yes   Comment: occasional drinker    Family History  Problem Relation Age of Onset   Emphysema Maternal Grandfather    Heart disease Maternal Grandmother    Diabetes Maternal Grandmother    Kidney failure Maternal Grandmother      ROS: Denies fever, fatigue, unexplained weight loss/gain, hearing or vision changes, cardiac or respiratory complaints.  Denies neurological deficits, musculoskeletal complaints, gastrointestinal or genitourinary complaints, mental health complaints, and skin changes.   Objective:   There were no vitals filed for this visit.  GENERAL APPEARANCE: Well-appearing, in NAD. Well nourished.  SKIN: Pink, warm and dry. Turgor normal. No rash, lesion, ulceration, or ecchymoses. Hair evenly distributed.  HEENT: HEAD: Normocephalic.  EYES: PERRLA. EOMI. Lids intact w/o defect. Sclera white, Conjunctiva pink w/o exudate.  EARS: External ear w/o redness, swelling, masses or lesions. EAC clear. TM's intact, translucent w/o bulging, appropriate landmarks visualized. Appropriate acuity to conversational tones.  NOSE: Septum midline w/o deformity. Nares patent, mucosa pink and non-inflamed w/o drainage. No sinus tenderness.  THROAT: Uvula midline. Oropharynx clear. Tonsils non-inflamed w/o exudate ***. Oral mucosa pink and moist.  NECK: Supple, Trachea midline. Full ROM w/o pain or tenderness. No lymphadenopathy. Thyroid non-tender w/o enlargement or palpable masses.  BREASTS: Breasts pendulous, symmetrical, and w/o palpable masses. Nipples everted and w/o discharge. No rash or skin retraction. No axillary or supraclavicular lymphadenopathy.  RESPIRATORY: Chest wall symmetrical w/o masses. Respirations even and non-labored. Breath sounds clear to auscultation bilaterally. No  wheezes, rales, rhonchi, or crackles. CARDIAC: S1, S2 present, regular rate and rhythm. No gallops, murmurs, rubs, or clicks. No carotid bruits. Capillary refill <2 seconds. Peripheral pulses 2+ bilaterally. GI: Abdomen soft w/o distention. Normoactive bowel sounds. No palpable masses or tenderness. No guarding or rebound tenderness. Liver and spleen w/o tenderness or enlargement. No CVA tenderness.  GU: *** External genitalia without erythema, lesions, or masses. No lymphadenopathy. Vaginal mucosa pink and moist without exudate, lesions, or ulcerations. Cervix  pink without discharge. Cervical os closed. Uterus and adnexae palpable, not enlarged, and w/o tenderness. No palpable masses.  MSK: Muscle tone and strength appropriate for age, w/o atrophy or abnormal movement.  EXTREMITIES: Active ROM intact, w/o tenderness, crepitus, or contracture. No obvious joint deformities or effusions. No clubbing, edema, or cyanosis.  NEUROLOGIC: CN's II-XII intact. Motor strength symmetrical with no obvious weakness. No sensory deficits. DTR's 2+ symmetric bilaterally. Steady, even gait.  PSYCH/MENTAL STATUS: Alert, oriented x 3. Cooperative, appropriate mood and affect.   Chaperoned by Mary Sella CMA ***  Results for orders placed or performed during the hospital encounter of 10/03/23  Surgical pathology   Collection Time: 10/03/23 12:00 AM  Result Value Ref Range   SURGICAL PATHOLOGY      SURGICAL PATHOLOGY [facility] [address] [place], [place] [address] Telephone [phone] or [phone] Fax [phone]  REPORT OF SURGICAL PATHOLOGY   Accession #: [id number] Patient Name: IYLA, BALZARINI Visit # : [id number]  MRN: [id number] Physician: Amie Portland DOB/Age [birth year] (Age: 5) Gender: F Collected Date: 10/03/2023 Received Date: 10/03/2023  FINAL DIAGNOSIS       1. Breast, right, needle core biopsy, 10 o'clock, 3 cmfn, ribbon clip, ultrasound guided :       - INTRADUCTAL PAPILLOMA WITH FIBROSIS, SEE NOTE      - NEGATIVE FOR CARCINOMA       Diagnosis Note : [facility] was notified on      10/04/2023.      DATE SIGNED OUT: 10/04/2023 ELECTRONIC SIGNATURE : Kenard Gower Md, Nilesh, Pathologist, Electronic Signature  MICROSCOPIC DESCRIPTION  CASE COMMENTS STAINS USED IN DIAGNOSIS: H&E-2 H&E-3 H&E-4 H&E    CLINICAL HISTORY  SPECIMEN(S) OBTAINED 1. Breast, right,  needle core biopsy, 10 O'clock, 3 Cmfn, Ribbon Clip, Ultrasound Guided  SPECIMEN COMMENTS: 1.  TIF: 1:14 pm CIT: < 1 minute, 8 mm indeterminate mass, no previous biopsy SPECIMEN CLINICAL INFORMATION: 1. Benign (favor fibroadenoma) vs carcinoma    Gross Description 1. Received in formalin, labeled "RT BR 10 o'clock 3 cm FN", are multiple fibrofatty tissue cores, 0.5-1.7 cm in length, submitted entirely in block 1A.      TIF: 1:14 p.m. on 10/03/23      CIT: Less than 1 minute      Biopsy clip: Ribbon      SMB      10/03/2023        Report signed out from the following location(s) [place] [address], [place] [address] CLIA #: [id number]  [facility] [address] [place], [place] [address] CLIA #: [id number]     Assessment & Plan:  There are no diagnoses linked to this encounter.  No orders of the defined types were placed in this encounter.   PATIENT COUNSELING:  - Encouraged a healthy well-balanced diet. Patient may adjust caloric intake to maintain or achieve ideal body weight. May reduce intake of dietary saturated fat and  total fat and have adequate dietary potassium and calcium preferably from fresh fruits, vegetables, and low-fat dairy products.   - Advised to avoid cigarette smoking. - Discussed with the patient that most people either abstain from alcohol or drink within safe limits (<=14/week and <=4 drinks/occasion for males, <=7/weeks and <= 3 drinks/occasion for females) and that the risk for alcohol disorders and other health effects rises proportionally with the number of drinks per week and how often a drinker exceeds daily limits. - Discussed cessation/primary prevention of drug use and availability of treatment for abuse.  - Discussed sexually transmitted diseases, avoidance of unintended pregnancy and contraceptive alternatives.  - Stressed the importance of regular exercise - Injury prevention: Discussed safety belts, safety helmets, smoke detector, smoking near bedding or upholstery.  - Dental  health: Discussed importance of regular tooth brushing, flossing, and dental visits.   NEXT PREVENTATIVE PHYSICAL DUE IN 1 YEAR.  No follow-ups on file.  Salvatore Decent, FNP

## 2024-03-08 ENCOUNTER — Encounter: Payer: Self-pay | Admitting: Internal Medicine

## 2024-03-08 ENCOUNTER — Ambulatory Visit (INDEPENDENT_AMBULATORY_CARE_PROVIDER_SITE_OTHER): Payer: Medicaid Other | Admitting: Internal Medicine

## 2024-03-08 VITALS — BP 115/75 | HR 85 | Temp 98.0°F | Resp 18 | Ht 64.0 in | Wt 164.8 lb

## 2024-03-08 DIAGNOSIS — Z23 Encounter for immunization: Secondary | ICD-10-CM

## 2024-03-08 DIAGNOSIS — Z131 Encounter for screening for diabetes mellitus: Secondary | ICD-10-CM

## 2024-03-08 DIAGNOSIS — Z0000 Encounter for general adult medical examination without abnormal findings: Secondary | ICD-10-CM

## 2024-03-08 DIAGNOSIS — Z1329 Encounter for screening for other suspected endocrine disorder: Secondary | ICD-10-CM

## 2024-03-08 DIAGNOSIS — Z13220 Encounter for screening for lipoid disorders: Secondary | ICD-10-CM

## 2024-03-08 DIAGNOSIS — Z1211 Encounter for screening for malignant neoplasm of colon: Secondary | ICD-10-CM

## 2024-03-08 LAB — COMPREHENSIVE METABOLIC PANEL
ALT: 10 U/L (ref 0–35)
AST: 15 U/L (ref 0–37)
Albumin: 4.5 g/dL (ref 3.5–5.2)
Alkaline Phosphatase: 46 U/L (ref 39–117)
BUN: 9 mg/dL (ref 6–23)
CO2: 28 meq/L (ref 19–32)
Calcium: 9.5 mg/dL (ref 8.4–10.5)
Chloride: 105 meq/L (ref 96–112)
Creatinine, Ser: 0.76 mg/dL (ref 0.40–1.20)
GFR: 93.41 mL/min (ref >60.00)
Glucose, Bld: 89 mg/dL (ref 70–99)
Potassium: 5.2 meq/L — ABNORMAL HIGH (ref 3.5–5.1)
Sodium: 138 meq/L (ref 135–145)
Total Bilirubin: 0.3 mg/dL (ref 0.2–1.2)
Total Protein: 6.8 g/dL (ref 6.0–8.3)

## 2024-03-08 LAB — LIPID PANEL
Cholesterol: 143 mg/dL (ref 0–200)
HDL: 73.2 mg/dL (ref >39.00)
LDL Cholesterol: 60 mg/dL (ref 0–99)
NonHDL: 69.69
Total CHOL/HDL Ratio: 2
Triglycerides: 49 mg/dL (ref 0.0–149.0)
VLDL: 9.8 mg/dL (ref 0.0–40.0)

## 2024-03-08 LAB — HEMOGLOBIN A1C: Hgb A1c MFr Bld: 5.3 % (ref 4.6–6.5)

## 2024-03-08 LAB — TSH: TSH: 1.57 u[IU]/mL (ref 0.35–5.50)

## 2024-03-13 ENCOUNTER — Encounter: Payer: Self-pay | Admitting: Internal Medicine

## 2024-03-15 ENCOUNTER — Telehealth: Payer: Self-pay

## 2024-03-15 NOTE — Telephone Encounter (Signed)
 Spoke to Pt and answered all questions and concerns. Pt verbalized understanding and lab appointment was schedule to repeat potassium levels.

## 2024-03-15 NOTE — Telephone Encounter (Signed)
 Copied from CRM [id number]. Topic: General - Other >> Mar 15, 2024 11:22 AM Eunice Blase wrote: Reason for CRM: Pt returning call stated will review labs on MyChart and if she has any questions she will call back.

## 2024-03-25 NOTE — Telephone Encounter (Signed)
 Pt scheduled for 3/31 lab visit - no orders are entered.

## 2024-03-26 ENCOUNTER — Encounter: Payer: Self-pay | Admitting: Internal Medicine

## 2024-03-26 ENCOUNTER — Other Ambulatory Visit: Payer: Self-pay | Admitting: Internal Medicine

## 2024-03-26 ENCOUNTER — Other Ambulatory Visit (INDEPENDENT_AMBULATORY_CARE_PROVIDER_SITE_OTHER)

## 2024-03-26 DIAGNOSIS — E875 Hyperkalemia: Secondary | ICD-10-CM

## 2024-03-26 LAB — BASIC METABOLIC PANEL WITH GFR
BUN: 10 mg/dL (ref 6–23)
CO2: 26 meq/L (ref 19–32)
Calcium: 8.9 mg/dL (ref 8.4–10.5)
Chloride: 103 meq/L (ref 96–112)
Creatinine, Ser: 0.76 mg/dL (ref 0.40–1.20)
GFR: 93.38 mL/min (ref >60.00)
Glucose, Bld: 80 mg/dL (ref 70–99)
Potassium: 3.9 meq/L (ref 3.5–5.1)
Sodium: 136 meq/L (ref 135–145)

## 2024-03-26 NOTE — Telephone Encounter (Signed)
 Patient returning for lab appt please enter orders

## 2024-03-26 NOTE — Telephone Encounter (Signed)
 Orders in

## 2024-03-28 ENCOUNTER — Encounter: Payer: Self-pay | Admitting: Gastroenterology

## 2024-04-06 ENCOUNTER — Ambulatory Visit (AMBULATORY_SURGERY_CENTER)

## 2024-04-06 VITALS — Ht 64.0 in | Wt 164.0 lb

## 2024-04-06 DIAGNOSIS — Z1211 Encounter for screening for malignant neoplasm of colon: Secondary | ICD-10-CM

## 2024-04-06 MED ORDER — PEG 3350-KCL-NA BICARB-NACL 420 G PO SOLR
4000.0000 mL | Freq: Once | ORAL | 0 refills | Status: AC
Start: 2024-04-06 — End: 2024-04-06

## 2024-04-06 NOTE — Progress Notes (Signed)

## 2024-04-25 ENCOUNTER — Other Ambulatory Visit (HOSPITAL_COMMUNITY)
Admission: RE | Admit: 2024-04-25 | Discharge: 2024-04-25 | Disposition: A | Discharge status: Home / Self Care | Source: Ambulatory Visit | Attending: Family Medicine | Admitting: Family Medicine

## 2024-04-25 ENCOUNTER — Other Ambulatory Visit: Payer: Self-pay

## 2024-04-25 ENCOUNTER — Ambulatory Visit (INDEPENDENT_AMBULATORY_CARE_PROVIDER_SITE_OTHER): Payer: Medicaid Other | Admitting: Obstetrics and Gynecology

## 2024-04-25 ENCOUNTER — Encounter: Payer: Self-pay | Admitting: Obstetrics and Gynecology

## 2024-04-25 VITALS — BP 114/85 | HR 93 | Ht 64.0 in | Wt 163.0 lb

## 2024-04-25 DIAGNOSIS — Z124 Encounter for screening for malignant neoplasm of cervix: Secondary | ICD-10-CM | POA: Insufficient documentation

## 2024-04-25 DIAGNOSIS — Z01419 Encounter for gynecological examination (general) (routine) without abnormal findings: Secondary | ICD-10-CM

## 2024-04-25 DIAGNOSIS — N951 Menopausal and female climacteric states: Secondary | ICD-10-CM | POA: Diagnosis not present

## 2024-04-25 DIAGNOSIS — Z202 Contact with and (suspected) exposure to infections with a predominantly sexual mode of transmission: Secondary | ICD-10-CM | POA: Diagnosis present

## 2024-04-25 NOTE — Progress Notes (Signed)
 Obstetrics and Gynecology New Patient Evaluation  Appointment Date: 04/25/2024  OBGYN Clinic: [facility]  Primary Care Provider: Gavin Kast  Chief Complaint:  Chief Complaint  Patient presents with   Establish Care   Gynecologic Exam    History of Present Illness: Kelly Wang is a 48 y.o.  [id number] (LMP: 04/13/24), seen for the above chief complaint. Her past medical history is significant for nothing   Patient needing pap smear and wanting to re-establish care. Having some night sweats but still having qmonth, regular periods that are around 3 days but they are lighter  Review of Systems: Pertinent items are noted in HPI.   Patient Active Problem List   Diagnosis Date Noted   Tobacco use 09/15/2023   Mass of lower inner quadrant of right breast 09/15/2023   Moderate persistent asthma 04/19/2012   Pollen allergies 04/19/2012   Smoking 04/19/2012    Past Medical History:  Past Medical History:  Diagnosis Date   Allergy    Childhood common allergies   Asthma     Past Surgical History:  Past Surgical History:  Procedure Laterality Date   BREAST BIOPSY Right 10/03/2023   US  RT BREAST BX W LOC DEV 1ST LESION IMG BX SPEC US  GUIDE 10/03/2023 [facility]   BREAST EXCISIONAL BIOPSY Right    17-18 yrs old   BREAST SURGERY     CESAREAN SECTION  1999   Birth of child    Past Obstetrical History:  OB History  Gravida Para Term Preterm AB Living  4 1 1  0 3 1  SAB IAB Ectopic Multiple Live Births   3   1    # Outcome Date GA Lbr Len/2nd Weight Sex Type Anes PTL Lv  4 IAB           3 IAB           2 IAB           1 Term      CS-Unspec       Past Gynecological History: As per HPI. History of Pap Smear(s): Yes.   Last pap 2021, which was negative and HPV negative She is currently using condoms for contraception.   Social History:  Social History   Socioeconomic History   Marital status: Single    Spouse  name: Not on file   Number of children: Not on file   Years of education: Not on file   Highest education level: Associate degree: occupational, Scientist, product/process development, or vocational program  Occupational History   Occupation: hairdresser    Employer: [employer]  Tobacco Use   Smoking status: Every Day    Current packs/day: 0.30    Average packs/day: 0.3 packs/day for 6.0 years (1.8 ttl pk-yrs)    Types: Cigarettes   Smokeless tobacco: Never   Tobacco comments:    pt states she smokes a signal cig occasionally a month  Vaping Use   Vaping status: Never Used  Substance and Sexual Activity   Alcohol use: Yes    Alcohol/week: 2.0 standard drinks of alcohol    Types: 2 Glasses of wine per week    Comment: Just occasional drinking depends on events   Drug use: No   Sexual activity: Yes    Birth control/protection: None  Other Topics Concern   Not on file  Social History Narrative   Not on file   Social Drivers of Health   Financial Resource Strain: Medium Risk (04/25/2024)  Overall Financial Resource Strain (CARDIA)    Difficulty of Paying Living Expenses: Somewhat hard  Food Insecurity: Food Insecurity Present (04/25/2024)   Hunger Vital Sign    Worried About Running Out of Food in the Last Year: Sometimes true    Ran Out of Food in the Last Year: Sometimes true  Transportation Needs: No Transportation Needs (04/25/2024)   PRAPARE - Administrator, Civil Service (Medical): No    Lack of Transportation (Non-Medical): No  Physical Activity: Sufficiently Active (03/07/2024)   Exercise Vital Sign    Days of Exercise per Week: 7 days    Minutes of Exercise per Session: 30 min  Stress: No Stress Concern Present (03/07/2024)   [employer] of Occupational Health - Occupational Stress Questionnaire    Feeling of Stress : Only a little  Social Connections: Moderately Isolated (04/25/2024)   Social Connection and Isolation Panel [NHANES]    Frequency of Communication with Friends  and Family: More than three times a week    Frequency of Social Gatherings with Friends and Family: More than three times a week    Attends Religious Services: 1 to 4 times per year    Active Member of [employer] or Organizations: No    Attends Banker Meetings: Never    Marital Status: Never married  Intimate Partner Violence: Not At Risk (04/25/2024)   Humiliation, Afraid, Rape, and Kick questionnaire    Fear of Current or Ex-Partner: No    Emotionally Abused: No    Physically Abused: No    Sexually Abused: No    Family History:  Family History  Problem Relation Age of Onset   Stroke Mother    Esophageal cancer Father    Heart disease Maternal Grandmother    Diabetes Maternal Grandmother    Kidney failure Maternal Grandmother    Kidney disease Maternal Grandmother    Emphysema Maternal Grandfather    Rectal cancer Neg Hx    Colon cancer Neg Hx    Medications Charlena N. Storm had no medications administered during this visit. Current Outpatient Medications  Medication Sig Dispense Refill   albuterol  (VENTOLIN  HFA) 108 (90 Base) MCG/ACT inhaler Inhale 2 puffs into the lungs every 4 (four) hours as needed for wheezing or shortness of breath. 18 g 3   No current facility-administered medications for this visit.    Allergies Patient has no known allergies.   Physical Exam:  BP 114/85 (BP Location: Right Arm, Patient Position: Sitting, Cuff Size: Large)   Pulse 93   Ht 5\' 4"  (1.626 m)   Wt 163 lb (73.9 kg)   SpO2 100%   BMI 27.98 kg/m  Body mass index is 27.98 kg/m. General appearance: Well nourished, well developed female in no acute distress.  Neck:  Supple, normal appearance, and no thyromegaly  Cardiovascular: normal s1 and s2.  No murmurs, rubs or gallops. Respiratory:  Clear to auscultation bilateral. Normal respiratory effort Abdomen: positive bowel sounds and no masses, hernias; diffusely non tender to palpation, non distended Breasts: patient  declines to have breast exam. Neuro/Psych:  Normal mood and affect.  Skin:  Warm and dry.  Lymphatic:  No inguinal lymphadenopathy.   Cervical exam performed in the presence of a chaperone Pelvic exam: is not limited by body habitus EGBUS: within normal limits Vagina: within normal limits and with no blood or discharge in the vault Cervix: normal appearing cervix without tenderness, discharge or lesions. Uterus:  nonenlarged and non tender Adnexa:  normal  adnexa and no mass, fullness, tenderness Rectovaginal: deferred  Laboratory: none  Radiology: none  Assessment: patient doing well  Plan:  1. STD exposure (Primary) - RPR+HBsAg+HCVAb+... - Cytology - PAP( [place])  2. Cervical cancer screening - Cytology - PAP( [place])  3. Well woman exam Patient acknowledges need for repeat breast imaging which she states is due a year from her biopsy in October 2024  Peri-menopausal/menopausal education, tips/tricks d/w her.   RTC PRN   Future Appointments  Date Time Provider Department Center  05/04/2024  1:00 PM Elois Hair, MD [facility]    Tyler Gallant MD Attending Center for [facility])

## 2024-04-26 LAB — RPR+HBSAG+HCVAB+...
HIV Screen 4th Generation wRfx: NONREACTIVE
Hep C Virus Ab: NONREACTIVE
Hepatitis B Surface Ag: NEGATIVE
RPR Ser Ql: NONREACTIVE

## 2024-04-27 LAB — CYTOLOGY - PAP
Chlamydia: NEGATIVE
Comment: NEGATIVE
Comment: NEGATIVE
Comment: NEGATIVE
Comment: NORMAL
Diagnosis: NEGATIVE
High risk HPV: NEGATIVE
Neisseria Gonorrhea: NEGATIVE
Trichomonas: NEGATIVE

## 2024-05-03 ENCOUNTER — Encounter: Payer: Self-pay | Admitting: Obstetrics and Gynecology

## 2024-05-04 ENCOUNTER — Ambulatory Visit (AMBULATORY_SURGERY_CENTER): Admitting: Gastroenterology

## 2024-05-04 ENCOUNTER — Encounter: Payer: Self-pay | Admitting: Gastroenterology

## 2024-05-04 VITALS — BP 97/71 | HR 74 | Temp 98.1°F | Resp 22 | Ht 64.0 in | Wt 164.0 lb

## 2024-05-04 DIAGNOSIS — Z1211 Encounter for screening for malignant neoplasm of colon: Secondary | ICD-10-CM | POA: Diagnosis not present

## 2024-05-04 MED ORDER — SODIUM CHLORIDE 0.9 % IV SOLN: 500.0000 mL | Freq: Once | INTRAVENOUS | Status: DC

## 2024-05-04 NOTE — Progress Notes (Signed)
 Sedate, gd SR, tolerated procedure well, VSS, report to RN

## 2024-05-04 NOTE — Op Note (Signed)
 [facility] Patient Name: Kelly Wang Procedure Date: 05/04/2024 2:10 PM MRN: [id number] Endoscopist: Geralyn Knee E. Cherryl Corona , MD, [id number] Age: 48 Referring MD:  Date of Birth: 11/23/1976 Gender: Female Account #: [id number] Procedure:                Colonoscopy Indications:              Screening for colorectal malignant neoplasm, This                            is the patient's first colonoscopy Medicines:                Monitored Anesthesia Care Procedure:                Pre-Anesthesia Assessment:                           - Prior to the procedure, a History and Physical                            was performed, and patient medications and                            allergies were reviewed. The patient's tolerance of                            previous anesthesia was also reviewed. The risks                            and benefits of the procedure and the sedation                            options and risks were discussed with the patient.                            All questions were answered, and informed consent                            was obtained. Prior Anticoagulants: The patient has                            taken no anticoagulant or antiplatelet agents. ASA                            Grade Assessment: II - A patient with mild systemic                            disease. After reviewing the risks and benefits,                            the patient was deemed in satisfactory condition to                            undergo the procedure.  After obtaining informed consent, the colonoscope                            was passed under direct vision. Throughout the                            procedure, the patient's blood pressure, pulse, and                            oxygen saturations were monitored continuously. The                            CF [id number] #[id number] was introduced through the anus                            and advanced to  the the terminal ileum, with                            identification of the appendiceal orifice and IC                            valve. The colonoscopy was performed without                            difficulty. The patient tolerated the procedure                            well. The quality of the bowel preparation was                            good. The terminal ileum, ileocecal valve,                            appendiceal orifice, and rectum were photographed.                            The bowel preparation used was GoLYTELY via split                            dose instruction. Scope In: 2:21:03 PM Scope Out: 2:33:22 PM Scope Withdrawal Time: 0 hours 7 minutes 51 seconds  Total Procedure Duration: 0 hours 12 minutes 19 seconds  Findings:                 The perianal and digital rectal examinations were                            normal. Pertinent negatives include normal                            sphincter tone and no palpable rectal lesions.                           The colon (entire examined portion) appeared normal.  The terminal ileum appeared normal.                           The retroflexed view of the distal rectum and anal                            verge was normal and showed no anal or rectal                            abnormalities. Complications:            No immediate complications. Estimated Blood Loss:     Estimated blood loss: none. Impression:               - The entire examined colon is normal.                           - The examined portion of the ileum was normal.                           - The distal rectum and anal verge are normal on                            retroflexion view.                           - No specimens collected. Recommendation:           - Patient has a contact number available for                            emergencies. The signs and symptoms of potential                            delayed complications  were discussed with the                            patient. Return to normal activities tomorrow.                            Written discharge instructions were provided to the                            patient.                           - Resume previous diet.                           - Continue present medications.                           - Repeat colonoscopy in 10 years for screening                            purposes. Wilfred Dayrit E. Cherryl Corona, MD 05/04/2024 2:37:04 PM This report has been signed electronically.

## 2024-05-04 NOTE — Progress Notes (Signed)
 [facility] History and Physical   Primary Care Physician:  Gavin Kast, FNP   Reason for Procedure:   Colon cancer screening  Plan:    Screening colonoscopy     HPI: Kelly Wang is a 48 y.o. female undergoing initial average risk screening colonoscopy.  She has no family history of colon cancer and no chronic GI symptoms.    Past Medical History:  Diagnosis Date   Allergy    Childhood common allergies   Asthma     Past Surgical History:  Procedure Laterality Date   BREAST BIOPSY Right 10/03/2023   US  RT BREAST BX W LOC DEV 1ST LESION IMG BX SPEC US  GUIDE 10/03/2023 [facility]   BREAST EXCISIONAL BIOPSY Right    17-18 yrs old   BREAST SURGERY     CESAREAN SECTION  1999   Birth of child    Prior to Admission medications   Medication Sig Start Date End Date Taking? Authorizing Provider  albuterol  (VENTOLIN  HFA) 108 (90 Base) MCG/ACT inhaler Inhale 2 puffs into the lungs every 4 (four) hours as needed for wheezing or shortness of breath. 09/15/23  Yes Gavin Kast, FNP    Current Outpatient Medications  Medication Sig Dispense Refill   albuterol  (VENTOLIN  HFA) 108 (90 Base) MCG/ACT inhaler Inhale 2 puffs into the lungs every 4 (four) hours as needed for wheezing or shortness of breath. 18 g 3   Current Facility-Administered Medications  Medication Dose Route Frequency Provider Last Rate Last Admin   0.9 %  sodium chloride  infusion  500 mL Intravenous Once Elois Hair, MD        Allergies as of 05/04/2024   (No Known Allergies)    Family History  Problem Relation Age of Onset   Stroke Mother    Esophageal cancer Father    Heart disease Maternal Grandmother    Diabetes Maternal Grandmother    Kidney failure Maternal Grandmother    Kidney disease Maternal Grandmother    Emphysema Maternal Grandfather    Rectal cancer Neg Hx    Colon cancer Neg Hx     Social History   Socioeconomic History   Marital status: Single     Spouse name: Not on file   Number of children: Not on file   Years of education: Not on file   Highest education level: Associate degree: occupational, Scientist, product/process development, or vocational program  Occupational History   Occupation: hairdresser    Employer: [employer]  Tobacco Use   Smoking status: Every Day    Current packs/day: 0.30    Average packs/day: 0.3 packs/day for 6.0 years (1.8 ttl pk-yrs)    Types: Cigarettes   Smokeless tobacco: Never   Tobacco comments:    pt states she smokes a signal cig occasionally a month  Vaping Use   Vaping status: Never Used  Substance and Sexual Activity   Alcohol use: Yes    Alcohol/week: 2.0 standard drinks of alcohol    Types: 2 Glasses of wine per week    Comment: Just occasional drinking depends on events   Drug use: No   Sexual activity: Yes    Birth control/protection: None  Other Topics Concern   Not on file  Social History Narrative   Not on file   Social Drivers of Health   Financial Resource Strain: Medium Risk (04/25/2024)   Overall Financial Resource Strain (CARDIA)    Difficulty of Paying Living Expenses: Somewhat hard  Food Insecurity: Food Insecurity Present (04/25/2024)  Hunger Vital Sign    Worried About Running Out of Food in the Last Year: Sometimes true    Ran Out of Food in the Last Year: Sometimes true  Transportation Needs: No Transportation Needs (04/25/2024)   PRAPARE - Administrator, Civil Service (Medical): No    Lack of Transportation (Non-Medical): No  Physical Activity: Sufficiently Active (03/07/2024)   Exercise Vital Sign    Days of Exercise per Week: 7 days    Minutes of Exercise per Session: 30 min  Stress: No Stress Concern Present (03/07/2024)   [employer] of Occupational Health - Occupational Stress Questionnaire    Feeling of Stress : Only a little  Social Connections: Moderately Isolated (04/25/2024)   Social Connection and Isolation Panel [NHANES]    Frequency of Communication  with Friends and Family: More than three times a week    Frequency of Social Gatherings with Friends and Family: More than three times a week    Attends Religious Services: 1 to 4 times per year    Active Member of [employer] or Organizations: No    Attends Banker Meetings: Never    Marital Status: Never married  Intimate Partner Violence: Not At Risk (04/25/2024)   Humiliation, Afraid, Rape, and Kick questionnaire    Fear of Current or Ex-Partner: No    Emotionally Abused: No    Physically Abused: No    Sexually Abused: No    Review of Systems:  All other review of systems negative except as mentioned in the HPI.  Physical Exam: Vital signs BP 112/73   Pulse 98   Temp 98.1 F (36.7 C)   Ht 5\' 4"  (1.626 m)   Wt 164 lb (74.4 kg)   SpO2 98%   BMI 28.15 kg/m   General:   Alert,  Well-developed, well-nourished, pleasant and cooperative in NAD Airway:  Mallampati 1 Lungs:  Clear throughout to auscultation.   Heart:  Regular rate and rhythm; no murmurs, clicks, rubs,  or gallops. Abdomen:  Soft, nontender and nondistended. Normal bowel sounds.   Neuro/Psych:  Normal mood and affect. A and O x 3   Bibi Economos E. Cherryl Corona, MD [facility]

## 2024-05-04 NOTE — Progress Notes (Signed)
 Pt's states no medical or surgical changes since previsit or office visit.

## 2024-05-04 NOTE — Patient Instructions (Signed)
 Thank you for letting us  care for your healthcare needs! Repeat colonoscopy in 10 years.  YOU HAD AN ENDOSCOPIC PROCEDURE TODAY AT THE [facility]:   Refer to the procedure report that was given to you for any specific questions about what was found during the examination.  If the procedure report does not answer your questions, please call your gastroenterologist to clarify.  If you requested that your care partner not be given the details of your procedure findings, then the procedure report has been included in a sealed envelope for you to review at your convenience later.  YOU SHOULD EXPECT: Some feelings of bloating in the abdomen. Passage of more gas than usual.  Walking can help get rid of the air that was put into your GI tract during the procedure and reduce the bloating. If you had a lower endoscopy (such as a colonoscopy or flexible sigmoidoscopy) you may notice spotting of blood in your stool or on the toilet paper. If you underwent a bowel prep for your procedure, you may not have a normal bowel movement for a few days.  Please Note:  You might notice some irritation and congestion in your nose or some drainage.  This is from the oxygen used during your procedure.  There is no need for concern and it should clear up in a day or so.  SYMPTOMS TO REPORT IMMEDIATELY:  Following lower endoscopy (colonoscopy or flexible sigmoidoscopy):  Excessive amounts of blood in the stool  Significant tenderness or worsening of abdominal pains  Swelling of the abdomen that is new, acute  Fever of 100F or higher  For urgent or emergent issues, a gastroenterologist can be reached at any hour by calling [phone]. Do not use MyChart messaging for urgent concerns.    DIET:  We do recommend a small meal at first, but then you may proceed to your regular diet.  Drink plenty of fluids but you should avoid alcoholic beverages for 24 hours.  ACTIVITY:  You should plan to take it easy for  the rest of today and you should NOT DRIVE or use heavy machinery until tomorrow (because of the sedation medicines used during the test).    FOLLOW UP: Our staff will call the number listed on your records the next business day following your procedure.  We will call around 7:15- 8:00 am to check on you and address any questions or concerns that you may have regarding the information given to you following your procedure. If we do not reach you, we will leave a message.     If any biopsies were taken you will be contacted by phone or by letter within the next 1-3 weeks.  Please call us  at [phone] if you have not heard about the biopsies in 3 weeks.    SIGNATURES/CONFIDENTIALITY: You and/or your care partner have signed paperwork which will be entered into your electronic medical record.  These signatures attest to the fact that that the information above on your After Visit Summary has been reviewed and is understood.  Full responsibility of the confidentiality of this discharge information lies with you and/or your care-partner.

## 2024-05-07 ENCOUNTER — Telehealth: Payer: Self-pay

## 2024-05-07 NOTE — Telephone Encounter (Signed)
 Attempted f/u call. No answer, left VM.

## 2024-10-05 ENCOUNTER — Other Ambulatory Visit: Payer: Self-pay

## 2024-10-05 ENCOUNTER — Encounter: Payer: Self-pay | Admitting: Internal Medicine

## 2024-10-05 DIAGNOSIS — J4520 Mild intermittent asthma, uncomplicated: Secondary | ICD-10-CM

## 2024-10-05 MED ORDER — ALBUTEROL SULFATE HFA 108 (90 BASE) MCG/ACT IN AERS: 2.0000 | INHALATION_SPRAY | RESPIRATORY_TRACT | 3 refills | Status: DC | PRN

## 2024-10-08 ENCOUNTER — Other Ambulatory Visit: Payer: Self-pay | Admitting: Internal Medicine

## 2024-10-08 DIAGNOSIS — J4520 Mild intermittent asthma, uncomplicated: Secondary | ICD-10-CM

## 2024-10-08 NOTE — Telephone Encounter (Signed)
 Pt requesting refill for albuterol  (VENTOLIN  HFA) 108 (90 Base) MCG/ACT inhaler   LOV 03/08/24 FOV not schduled LRF 10/05/24

## 2024-10-11 ENCOUNTER — Ambulatory Visit: Admitting: Internal Medicine

## 2024-10-11 ENCOUNTER — Encounter: Payer: Self-pay | Admitting: Internal Medicine

## 2024-10-11 VITALS — BP 110/72 | HR 89 | Temp 97.8°F | Ht 64.0 in | Wt 162.6 lb

## 2024-10-11 DIAGNOSIS — J4541 Moderate persistent asthma with (acute) exacerbation: Secondary | ICD-10-CM | POA: Diagnosis not present

## 2024-10-11 MED ORDER — FLUTICASONE-SALMETEROL 100-50 MCG/ACT IN AEPB
1.0000 | INHALATION_SPRAY | Freq: Two times a day (BID) | RESPIRATORY_TRACT | 3 refills | Status: AC
Start: 2024-10-11 — End: ?

## 2024-10-11 MED ORDER — IPRATROPIUM-ALBUTEROL 0.5-2.5 (3) MG/3ML IN SOLN
3.0000 mL | Freq: Once | RESPIRATORY_TRACT | Status: AC
Start: 2024-10-11 — End: ?

## 2024-10-11 MED ORDER — ALBUTEROL SULFATE HFA 108 (90 BASE) MCG/ACT IN AERS: 2.0000 | INHALATION_SPRAY | RESPIRATORY_TRACT | 3 refills | Status: AC | PRN

## 2024-10-11 MED ORDER — PREDNISONE 10 MG (21) PO TBPK: ORAL_TABLET | ORAL | 0 refills | Status: DC

## 2024-10-11 NOTE — Progress Notes (Signed)
 [facility] [address] [place] [address] Dept: [phone] Dept Fax: [phone]    Subjective:   Kelly Wang [birth year] 10/11/2024  Chief Complaint  Patient presents with   Asthma    Need refills    HPI: Kelly Wang presents today for re-assessment and management of chronic medical conditions.  History of Present Illness   Kelly Wang is a 48 year old female with moderate persistent asthma who presents with an exacerbation of her asthma symptoms.  She has been experiencing increased wheezing and shortness of breath, which she attributes to recent stressors including work and personal issues, as well as changes in the weather. These factors have led to a noticeable exacerbation of her symptoms.  She has been using her albuterol  inhaler more frequently, almost daily. Typically only uses it a couple times a week. She ran out of her prescription, which prompted her to make this appointment. The albuterol  inhaler helps her symptoms, and she has previously used prednisone , which she finds helpful but prefers to avoid frequent use. She recalls being prescribed Advair  in the past but has primarily relied on her rescue inhaler.   She is a smoker. She works as a Lawyer in a nursing home and describes her work environment as stressful, which may exacerbate her symptoms. She recently experienced a significant personal loss with the death of her nephew in a car accident, adding to her stress levels.       The following portions of the patient's history were reviewed and updated as appropriate: past medical history, past surgical history, family history, social history, allergies, medications, and problem list.   Patient Active Problem List   Diagnosis Date Noted   Tobacco use 09/15/2023   Mass of lower inner quadrant of right breast 09/15/2023   Moderate persistent asthma 04/19/2012   Pollen  allergies 04/19/2012   Smoking 04/19/2012   Past Medical History:  Diagnosis Date   Allergy    Childhood common allergies   Asthma    Past Surgical History:  Procedure Laterality Date   BREAST BIOPSY Right 10/03/2023   US  RT BREAST BX W LOC DEV 1ST LESION IMG BX SPEC US  GUIDE 10/03/2023 [facility]   BREAST EXCISIONAL BIOPSY Right    17-18 yrs old   BREAST SURGERY     CESAREAN SECTION  1999   Birth of child   Family History  Problem Relation Age of Onset   Stroke Mother    Esophageal cancer Father    Heart disease Maternal Grandmother    Diabetes Maternal Grandmother    Kidney failure Maternal Grandmother    Kidney disease Maternal Grandmother    Emphysema Maternal Grandfather    Rectal cancer Neg Hx    Colon cancer Neg Hx     Current Outpatient Medications:    fluticasone -salmeterol (ADVAIR ) 100-50 MCG/ACT AEPB, Inhale 1 puff into the lungs 2 (two) times daily. Swish and spit with water after each use, Disp: 1 each, Rfl: 3   predniSONE  (STERAPRED UNI-PAK 21 TAB) 10 MG (21) TBPK tablet, Take as directed on packaging., Disp: 21 tablet, Rfl: 0   albuterol  (VENTOLIN  HFA) 108 (90 Base) MCG/ACT inhaler, Inhale 2 puffs into the lungs every 4 (four) hours as needed for wheezing or shortness of breath., Disp: 18 g, Rfl: 3  Current Facility-Administered Medications:    ipratropium-albuterol  (DUONEB) 0.5-2.5 (3) MG/3ML nebulizer solution 3 mL, 3 mL, Nebulization, Once,  No Known Allergies   ROS:  A complete ROS was performed with pertinent positives/negatives noted in the HPI. The remainder of the ROS are negative.    Objective:   Today's Vitals   10/11/24 1032  BP: 110/72  Pulse: 89  Temp: 97.8 F (36.6 C)  TempSrc: Temporal  SpO2: 97%  Weight: 162 lb 9.6 oz (73.8 kg)  Height: 5' 4 (1.626 m)    GENERAL: Well-appearing, in NAD. Well nourished.  SKIN: Pink, warm and dry.  NECK: Trachea midline. Full ROM w/o pain or tenderness. No lymphadenopathy.  RESPIRATORY:  Chest wall symmetrical. Respirations even.  Diminished throughout all fields. Difficulty talking in complete sentences.  CARDIAC: S1, S2 present, regular rate and rhythm. Peripheral pulses 2+ bilaterally.  PSYCH/MENTAL STATUS: Alert, oriented x 3. Cooperative, appropriate mood and affect.   POST NEB TX: Improved air movement throughout lung fields. Slightly diminished in bases. No wheezing, rales, rhonchi. Able to speak in complete sentences.  Health Maintenance Due  Topic Date Due   Hepatitis B Vaccines 19-59 Average Risk (1 of 3 - 19+ 3-dose series) Never done   Influenza Vaccine  07/27/2024   COVID-19 Vaccine (1 - 2025-26 season) Never done   Mammogram  09/26/2024    No results found for any visits on 10/11/24.  The 10-year ASCVD risk score (Arnett DK, et al., 2019) is: 0.6%     Assessment & Plan:  Assessment and Plan    Moderate persistent asthma with acute exacerbation Acute exacerbation of moderate persistent asthma likely due to stress, personal issues, and seasonal changes. Smoking exacerbates symptoms. - Administered duoneb nebulizer treatment in office. - Prescribed albuterol  inhaler, 2 puffs every 4 hours as needed. - Prescribed Advair  100/50 mcg, 1 puff twice daily, with swish and spit instructions. - Prescribed prednisone  tapering pack. - Advised follow-up if symptoms worsen or persist    No images are attached to the encounter or orders placed in the encounter.  Meds ordered this encounter  Medications   albuterol  (VENTOLIN  HFA) 108 (90 Base) MCG/ACT inhaler    Sig: Inhale 2 puffs into the lungs every 4 (four) hours as needed for wheezing or shortness of breath.    Dispense:  18 g    Refill:  3   ipratropium-albuterol  (DUONEB) 0.5-2.5 (3) MG/3ML nebulizer solution 3 mL   predniSONE  (STERAPRED UNI-PAK 21 TAB) 10 MG (21) TBPK tablet    Sig: Take as directed on packaging.    Dispense:  21 tablet    Refill:  0    Supervising Provider:   THOMPSON, AARON B [[id number]]    fluticasone -salmeterol (ADVAIR ) 100-50 MCG/ACT AEPB    Sig: Inhale 1 puff into the lungs 2 (two) times daily. Swish and spit with water after each use    Dispense:  1 each    Refill:  3    Supervising Provider:   SEBASTIAN BEVERLEY NOVAK [[id number]]    Return if symptoms worsen or fail to improve.   Rosina Senters, FNP

## 2024-12-12 ENCOUNTER — Ambulatory Visit: Admitting: Internal Medicine

## 2024-12-12 ENCOUNTER — Encounter: Payer: Self-pay | Admitting: Internal Medicine

## 2024-12-12 VITALS — BP 110/72 | HR 97 | Temp 97.7°F | Ht 64.0 in | Wt 161.0 lb

## 2024-12-12 DIAGNOSIS — L0291 Cutaneous abscess, unspecified: Secondary | ICD-10-CM | POA: Diagnosis not present

## 2024-12-12 NOTE — Addendum Note (Signed)
 Addended by: BILLY KNEE on: 12/12/2024 11:59 AM   Modules accepted: Orders

## 2024-12-12 NOTE — Addendum Note (Signed)
 Addended by: Rita Vialpando on: 12/12/2024 01:14 PM   Modules accepted: Orders

## 2024-12-12 NOTE — Progress Notes (Addendum)
 [facility] [address] [place] [address] Dept: [phone] Dept Fax: [phone]  Acute Care Office Visit  Subjective:   Kelly Wang 30-Nov-1976 12/12/2024  Chief Complaint  Patient presents with   Cyst    Left side of head swollen, painful to touch blood and white drainage, causing face swelling, ear pain    HPI:  Discussed the use of AI scribe software for clinical note transcription with the patient, who gave verbal consent to proceed.  History of Present Illness   Kelly Wang is a 48 year old female who presents with a large mass on her forehead and facial swelling.  Earlier last week, she noticed a large knot on her head. The swelling increased, particularly affecting her eye, which was swollen shut by Saturday morning.  She sought care at urgent care on Saturday due to the significant swelling. She received an antibiotic shot and a prescription for doxycycline, which she started on Sunday x 10 days. There has been some reduction in swelling since starting the antibiotics, but the mass remains problematic.  The mass was initially very large, extending into her eye and causing throbbing pain in her ear. She attempted to drain it herself, resulting in some blood and pus discharge, but stopped due to increased pain.  She denies having a fever.  She continues to experience facial discomfort and swelling, particularly around her cheeks and face.  She is currently taking doxycycline as prescribed by urgent care.      The following portions of the patient's history were reviewed and updated as appropriate: past medical history, past surgical history, family history, social history, allergies, medications, and problem list.   Patient Active Problem List   Diagnosis Date Noted   Tobacco use 09/15/2023   Mass of lower inner quadrant of right breast 09/15/2023   Moderate persistent asthma  04/19/2012   Pollen allergies 04/19/2012   Smoking 04/19/2012   Past Medical History:  Diagnosis Date   Allergy    Childhood common allergies   Asthma    Past Surgical History:  Procedure Laterality Date   BREAST BIOPSY Right 10/03/2023   US  RT BREAST BX W LOC DEV 1ST LESION IMG BX SPEC US  GUIDE 10/03/2023 [facility]   BREAST EXCISIONAL BIOPSY Right    17-18 yrs old   BREAST SURGERY     CESAREAN SECTION  1999   Birth of child   Family History  Problem Relation Age of Onset   Stroke Mother    Esophageal cancer Father    Heart disease Maternal Grandmother    Diabetes Maternal Grandmother    Kidney failure Maternal Grandmother    Kidney disease Maternal Grandmother    Emphysema Maternal Grandfather    Rectal cancer Neg Hx    Colon cancer Neg Hx    Current Medications[1] Allergies[2]   ROS: A complete ROS was performed with pertinent positives/negatives noted in the HPI. The remainder of the ROS are negative.    Objective:   Today's Vitals   12/12/24 1042  BP: 110/72  Pulse: 97  Temp: 97.7 F (36.5 C)  TempSrc: Temporal  SpO2: 98%  Weight: 161 lb (73 kg)  Height: 5' 4 (1.626 m)    GENERAL: Well-appearing, in NAD. Well nourished.  SKIN: Pink, warm and dry. Approx 3cm fluctuant mass to left forehead/temple with no current active drainage. (+) redness, erythema. Localized mild swelling to left upper eyelid and left cheek.  HEENT:  EYES:  Conjunctive pink without exudate. PERRL, EOMI.  EARS: External ear w/o redness, swelling, masses, or lesions. EAC clear. TM's intact, translucent w/o bulging, appropriate landmarks visualized.  NOSE: Septum midline w/o deformity. Nares patent, mucosa pink and non-inflamed w/o drainage.   NECK: Trachea midline. Full ROM w/o pain or tenderness. No lymphadenopathy.  RESPIRATORY: Chest wall symmetrical. Respirations even and non-labored.  EXTREMITIES: Without clubbing, cyanosis, or edema.  NEUROLOGIC:  Steady, even gait.   PSYCH/MENTAL STATUS: Alert, oriented x 3. Cooperative, appropriate mood and affect.    No results found for any visits on 12/12/24.    Assessment & Plan:  Assessment and Plan    Cutaneous abscess of scalp/face Abscess on left forehead, partially reduced with doxycycline. Incision and drainage necessary for resolution. - Referred to [facility] for incision and drainage. - Advised use of warm washcloths to affected area. - Continue doxycycline as prescribed. - Instructed to contact [facility] if not contacted by PCP referral team within hours - 1 day. - Provided contact information for [facility]. - Advised to follow up if symptoms worsen or fail to improve.   - UPDATE: Per referral coordinator, [facility] does not do procedures on neck or head. Needs to be seen by plastics. New referral placed.     - SECOND UPDATE: ENT referral to be placed as they can drain this for patient. Confirmed this with Dr. Anice by referral coordinator. New referral placed.    No orders of the defined types were placed in this encounter.  Orders Placed This Encounter  Procedures   Ambulatory referral to General Surgery    Referral Priority:   Urgent    Referral Type:   Surgical    Referral Reason:   Specialty Services Required    Requested Specialty:   General Surgery    Number of Visits Requested:   1   Ambulatory referral to Plastic Surgery    Referral Priority:   Urgent    Referral Type:   Surgical    Referral Reason:   Specialty Services Required    Requested Specialty:   Plastic Surgery    Number of Visits Requested:   1   Lab Orders  No laboratory test(s) ordered today   No images are attached to the encounter or orders placed in the encounter.  Return if symptoms worsen or fail to improve.   Rosina Senters, FNP     [1]  Current Outpatient Medications:    albuterol  (VENTOLIN  HFA) 108 (90 Base) MCG/ACT inhaler, Inhale 2 puffs into the lungs  every 4 (four) hours as needed for wheezing or shortness of breath., Disp: 18 g, Rfl: 3   doxycycline (VIBRAMYCIN) 100 MG capsule, Take 100 mg by mouth 2 (two) times daily., Disp: , Rfl:    fluticasone -salmeterol (ADVAIR ) 100-50 MCG/ACT AEPB, Inhale 1 puff into the lungs 2 (two) times daily. Swish and spit with water after each use, Disp: 1 each, Rfl: 3  Current Facility-Administered Medications:    ipratropium-albuterol  (DUONEB) 0.5-2.5 (3) MG/3ML nebulizer solution 3 mL, 3 mL, Nebulization, Once,  [2] No Known Allergies

## 2024-12-12 NOTE — Patient Instructions (Signed)
[facility] [address], [place], [place] [address] Phone: [phone]

## 2024-12-13 ENCOUNTER — Encounter (INDEPENDENT_AMBULATORY_CARE_PROVIDER_SITE_OTHER): Payer: Self-pay

## 2024-12-13 ENCOUNTER — Ambulatory Visit (INDEPENDENT_AMBULATORY_CARE_PROVIDER_SITE_OTHER)

## 2024-12-13 ENCOUNTER — Other Ambulatory Visit (HOSPITAL_COMMUNITY): Admission: RE | Admit: 2024-12-13 | Discharge: 2024-12-13 | Disposition: A | Discharge status: Home / Self Care

## 2024-12-13 VITALS — BP 111/78 | HR 85 | Wt 161.0 lb

## 2024-12-13 DIAGNOSIS — L0201 Cutaneous abscess of face: Secondary | ICD-10-CM | POA: Diagnosis present

## 2024-12-13 DIAGNOSIS — R0981 Nasal congestion: Secondary | ICD-10-CM | POA: Diagnosis not present

## 2024-12-13 DIAGNOSIS — J302 Other seasonal allergic rhinitis: Secondary | ICD-10-CM | POA: Diagnosis not present

## 2024-12-13 MED ORDER — SULFAMETHOXAZOLE-TRIMETHOPRIM 800-160 MG PO TABS: 1.0000 | ORAL_TABLET | Freq: Two times a day (BID) | ORAL | 0 refills | Status: AC

## 2024-12-13 MED ORDER — MUPIROCIN 2 % EX OINT: 1.0000 | TOPICAL_OINTMENT | Freq: Two times a day (BID) | CUTANEOUS | 0 refills | Status: AC

## 2024-12-13 MED ORDER — FLUTICASONE PROPIONATE 50 MCG/ACT NA SUSP: 2.0000 | Freq: Every day | NASAL | 6 refills | Status: AC

## 2024-12-13 NOTE — Progress Notes (Signed)
 Dear Dr. Billy, Here is my assessment for our mutual patient, Kelly Wang. Thank you for allowing me the opportunity to care for your patient. Please do not hesitate to contact me should you have any other questions. Sincerely, Dr. Penne Croak  Otolaryngology Clinic Note Referring provider: Dr. Billy HPI:  Discussed the use of AI scribe software for clinical note transcription with the patient, who gave verbal consent to proceed.  History of Present Illness Kelly Wang is a 48 year old female who presents with a one-week history of a painful, enlarging left temporal cutaneous abscess with associated facial swelling.  Left temporal cutaneous abscess and facial swelling - Onset one week ago with initial sensation of irritation over the left temple, without visible lesion or preceding trauma - Overnight development of a small nodule that rapidly enlarged over several days - Significant swelling of the left face and periorbital region - Persistent pain and tenderness, with swelling extending to the left eye and tenderness in the left ear - Severe discomfort and distress, with concern for cosmetic impact and potential scarring - Some reduction in swelling since starting oral doxycycline four days ago - Possible spontaneous drainage of the abscess overnight - Manual manipulation of the area and application of topical ointment, but abscess persists - No prior history of abscesses  Systemic and associated symptoms - No nasal or throat symptoms - No history of diabetes, hypertension, or other chronic illnesses  Occupational and psychosocial concerns - 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   PMH/Meds/All/SocHx/FamHx/ROS:   Past Medical History:  Diagnosis Date   Allergy    Childhood common allergies   Asthma      Past Surgical History:  Procedure Laterality Date   BREAST BIOPSY Right 10/03/2023   US  RT BREAST BX W LOC DEV 1ST LESION IMG BX SPEC US  GUIDE 10/03/2023 [facility]   BREAST EXCISIONAL BIOPSY Right    17-18 yrs old   BREAST SURGERY     CESAREAN SECTION  1999   Birth of child    Family History  Problem Relation Age of Onset   Stroke Mother    Esophageal cancer Father    Heart disease Maternal Grandmother    Diabetes Maternal Grandmother    Kidney failure Maternal Grandmother    Kidney disease Maternal Grandmother    Emphysema Maternal Grandfather    Rectal cancer Neg Hx    Colon cancer Neg Hx      Social Connections: Moderately Isolated (10/10/2024)   Social Connection and Isolation Panel    Frequency of Communication with Friends and Family: More than three times a week    Frequency of Social Gatherings with Friends and Family: Once a week    Attends Religious Services: 1 to 4 times per year    Active Member of [employer] or Organizations: No    Attends Engineer, Structural: Not on file    Marital Status: Never married     Current Medications[1]   Physical Exam:   BP 111/78 (BP Location: Right Arm, Patient Position: Sitting, Cuff Size: Normal)   Pulse 85   Wt 161 lb (73 kg)   LMP 11/26/2024 (Exact Date)   SpO2 97%  BMI 27.64 kg/m   The patient was awake, alert, and appropriate. The external ears were inspected, and otoscopy was performed to evaluate the external auditory canals and tympanic membranes. The nasal cavity and septum were examined for mucosal changes, obstruction, or discharge. The oral cavity and oropharynx were inspected for mucosal lesions, infection, or tonsillar hypertrophy. The neck was palpated for lymphadenopathy, thyroid  abnormalities, or other masses. Cranial nerve  function was grossly intact.  Pertinent Findings: General: Well developed, well nourished. No acute distesss. Voice without hoarseness Head/Face: Normocephalic. No sinus tenderness. Facial nerve intact and equal bilaterally. No facial lacerations. Eyes: PERRL, no scleral icterus or conjunctival hemorrhage. EOMI. Ears: No gross deformity. Normal external canal. Tympanic membrane with normal landmarks bilaterally Hearing: Normal speech reception.  Nose: No gross deformity or lesions. No purulent discharge. No turbinate hypertrophy.  Mouth/Oropharynx: Lips without any lesions. Dentition good. No mucosal lesions within the oropharynx. No tonsillar enlargement, exudate, or lesions. Pharyngeal walls symmetrical. Uvula midline. Tongue midline without lesions. Larynx: See TFL if applicable Nasopharynx: See TFL if applicable Neck: Trachea midline. No masses. No thyromegaly or nodules palpated. No crepitus. Lymphatic: No lymphadenopathy in the neck. Respiratory: No stridor or distress. Room air. Cardiovascular: Regular rate and rhythm. Extremities: No edema or cyanosis. Warm and well-perfused. Skin: No scars or lesions on face or neck. Neurologic: CN II-XII grossly intact. Moving all extremities without gross abnormality. Other:  Physical Exam HEENT: Atraumatic, normocephalic. Left temple abscess with eschar and purulent drainage. Tympanic membrane with normal landmarks.   Seprately Identifiable Procedures:  I personally ordered, reviewed and interpreted the following with the patient today  Procedure Note Pre-procedure diagnosis: Concern for left temple Abscess Post-procedure diagnosis: Same Procedure: Incision and Drainage of temple Abscess, CPT 10061 Complications: None apparent EBL: 2cc mL Date: 12/13/2024   Indication: Kelly Wang is a 47 y.o.  female with concern for left temple abscess worsening on 4 days of antibiotics . Decision was made for incision and drainage of  abscess following discussion of risks/benefits of procedure.  The patient was identified as the correct patient.  The patient is able to give consent, therefore  consent was obtained. Surrounding skin was cleansed, draped and prepped with betadine in usual fashion for procedure. 1% Lidocaine  with 1:100,000 epinephrine surrounding the area of fluctuance.  A stab incision was made through skin and dermis posterior to the eschar. The eschar was removed. Hemostat was used to dissect into abscess pocket. Three loculations were encountered and deep to dermis. Pocket measured about 3 cm in diameter. Purulence was encountered. Wound culture was obtained. Loculation were broken up with hemostat. Wound was irrigated into abscess pocket and left open to drain. The skin was cleansed.   Patient tolerated this procedure well and there were no immediately apparent complications.    Electronically signed by: Penne Croak, DO 12/13/2024 8:17 PM   Impression & Plans:  Kailei Cowens is a 48 y.o. female  1. Abscess of temple region   2. Seasonal allergies   3. Nasal congestion     - Findings and diagnoses discussed in detail with the patient. - Risks, benefits, and alternatives were reviewed. Through shared decision making, the patient elects to proceed with below. Assessment & Plan Cutaneous abscess of the left temple Acute cutaneous abscess with deep extension and significant purulence, likely MRSA-related. Surgical drainage required. Discussed risks of pain, bleeding, infection, and scarring. - Performed incision and drainage under local anesthesia with lidocaine . - Obtained wound cultures for microbiological analysis. - Irrigated  abscess cavity and applied dressing, ensuring cosmetic coverage with hair. - Prescribed topical antibiotic ointment for local wound care. - Planned oral antibiotic with MRSA coverage pending review; advised discontinuation of doxycycline if new antibiotic prescribed. -  Provided anticipatory guidance on pain, bleeding, infection risk, and scarring. - Advised monitoring for worsening infection signs; seek hospital care for IV antibiotics if symptoms worsen. - Provided work excuse note due to open wound and infection risk. - Scheduled follow-up in one week to reassess healing and response.  Allergic rhinitis Chronic allergic rhinitis with nasal congestion, self-managed with oral antihistamines. No prior use of intranasal corticosteroids. - Recommended daily fluticasone  (Flonase ) nasal spray for improved symptom control. - Provided education on daily use and adherence for optimal efficacy.  - Orders placed:  Orders Placed This Encounter  Procedures   Aerobic/Anaerobic Culture w Gram Stain (surgical/deep wound)   - Medications prescribed/continued/adjusted:  Meds ordered this encounter  Medications   fluticasone  (FLONASE ) 50 MCG/ACT nasal spray    Sig: Place 2 sprays into both nostrils daily.    Dispense:  16 g    Refill:  6   sulfamethoxazole -trimethoprim  (BACTRIM  DS) 800-160 MG tablet    Sig: Take 1 tablet by mouth 2 (two) times daily.    Dispense:  20 tablet    Refill:  0   mupirocin  ointment (BACTROBAN ) 2 %    Sig: Apply 1 Application topically 2 (two) times daily. Apply small amount to area    Dispense:  22 g    Refill:  0   - Education materials provided to the patient. - Follow up: 1 week. Patient instructed to return sooner or go to the ED if new/worsening symptoms develop.   Thank you for allowing me the opportunity to care for your patient. Please do not hesitate to contact me should you have any other questions.  Sincerely, Penne Croak, DO Otolaryngologist (ENT) [facility] Phone: [phone] Fax: [phone]  12/13/2024, 8:14 PM        [1]  Current Outpatient Medications:    albuterol  (VENTOLIN  HFA) 108 (90 Base) MCG/ACT inhaler, Inhale 2 puffs into the lungs every 4 (four) hours as needed for wheezing  or shortness of breath., Disp: 18 g, Rfl: 3   fluticasone -salmeterol (ADVAIR ) 100-50 MCG/ACT AEPB, Inhale 1 puff into the lungs 2 (two) times daily. Swish and spit with water after each use, Disp: 1 each, Rfl: 3   fluticasone  (FLONASE ) 50 MCG/ACT nasal spray, Place 2 sprays into both nostrils daily., Disp: 16 g, Rfl: 6   mupirocin  ointment (BACTROBAN ) 2 %, Apply 1 Application topically 2 (two) times daily. Apply small amount to area, Disp: 22 g, Rfl: 0   sulfamethoxazole -trimethoprim  (BACTRIM  DS) 800-160 MG tablet, Take 1 tablet by mouth 2 (two) times daily., Disp: 20 tablet, Rfl: 0  Current Facility-Administered Medications:    ipratropium-albuterol  (DUONEB) 0.5-2.5 (3) MG/3ML nebulizer solution 3 mL, 3 mL, Nebulization, Once,

## 2024-12-18 LAB — AEROBIC/ANAEROBIC CULTURE W GRAM STAIN (SURGICAL/DEEP WOUND)

## 2024-12-21 ENCOUNTER — Encounter (INDEPENDENT_AMBULATORY_CARE_PROVIDER_SITE_OTHER): Payer: Self-pay

## 2024-12-21 ENCOUNTER — Ambulatory Visit (INDEPENDENT_AMBULATORY_CARE_PROVIDER_SITE_OTHER)

## 2024-12-21 VITALS — BP 99/66 | HR 72 | Wt 161.0 lb

## 2024-12-21 DIAGNOSIS — Z09 Encounter for follow-up examination after completed treatment for conditions other than malignant neoplasm: Secondary | ICD-10-CM

## 2024-12-21 DIAGNOSIS — Z22322 Carrier or suspected carrier of Methicillin resistant Staphylococcus aureus: Secondary | ICD-10-CM

## 2024-12-21 DIAGNOSIS — L0201 Cutaneous abscess of face: Secondary | ICD-10-CM

## 2024-12-22 NOTE — Progress Notes (Signed)
 Discussed the use of AI scribe software for clinical note transcription with the patient, who gave verbal consent to proceed.  History of Present Illness Kelly Wang is a 48 year old female with a recent left temporal cutaneous abscess due to MRSA who presents for follow-up of wound healing.  Cutaneous abscess of left temporal region - Developed a left temporal facial abscess at work, confirmed to be due to methicillin-resistant Staphylococcus aureus (MRSA) - Abscess was incised and drained on December 13, 2024 - Significant improvement in the lesion since intervention - Resolution of purulent drainage - Concern remains regarding complete resolution of infection and minimizing facial scarring  Wound care and antibiotic therapy - Adherent to prescribed regimen including topical antibiotic ointment and oral antibiotics - Peroxide cleansing performed regularly - Bandage maintained while at work   Reviewed culture - MRSA Physical Exam  Left temple with healing abscess site. No purulence, surrounding erythema or tenderness to palpation.   Assessment & Plan Left temporal cutaneous abscess due to MRSA Abscess healing well post-incision and drainage with no purulent drainage. Wound improving with adherence to antibiotics and hygiene. Discussed scarring concerns and MRSA colonization persistence. - Continue mupirocin  ointment until complete epithelialization. - Continue oral antibiotics as prescribed. - Maintain wound hygiene and cover area at work. - Initiate silicone-based scar ointment or sheets nightly post-closure. - Apply sunscreen to area when exposed to sunlight for one year. 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
